# Patient Record
Sex: Female | Born: 1945 | Race: White | Hispanic: Yes
Health system: Southern US, Community
[De-identification: ages and names within clinical notes are randomized; demographics above are authoritative.]

## PROBLEM LIST (undated history)

## (undated) DIAGNOSIS — E119 Type 2 diabetes mellitus without complications: Secondary | ICD-10-CM

## (undated) DIAGNOSIS — I10 Essential (primary) hypertension: Secondary | ICD-10-CM

## (undated) DIAGNOSIS — F419 Anxiety disorder, unspecified: Secondary | ICD-10-CM

## (undated) DIAGNOSIS — G4700 Insomnia, unspecified: Secondary | ICD-10-CM

## (undated) DIAGNOSIS — IMO0001 Reserved for inherently not codable concepts without codable children: Secondary | ICD-10-CM

## (undated) DIAGNOSIS — K219 Gastro-esophageal reflux disease without esophagitis: Secondary | ICD-10-CM

## (undated) DIAGNOSIS — K2270 Barrett's esophagus without dysplasia: Secondary | ICD-10-CM

## (undated) DIAGNOSIS — R739 Hyperglycemia, unspecified: Secondary | ICD-10-CM

## (undated) HISTORY — PX: VAGINAL HYSTERECTOMY: SUR661

## (undated) HISTORY — DX: Anxiety disorder, unspecified: F41.9

## (undated) HISTORY — PX: CHOLECYSTECTOMY: SHX55

## (undated) HISTORY — DX: Gastro-esophageal reflux disease without esophagitis: K21.9

## (undated) HISTORY — PX: APPENDECTOMY: SHX54

## (undated) HISTORY — DX: Reserved for inherently not codable concepts without codable children: IMO0001

## (undated) HISTORY — DX: Type 2 diabetes mellitus without complications: E11.9

## (undated) HISTORY — DX: Barrett's esophagus without dysplasia: K22.70

## (undated) HISTORY — DX: Insomnia, unspecified: G47.00

## (undated) HISTORY — DX: Essential (primary) hypertension: I10

## (undated) HISTORY — PX: OTHER SURGICAL HISTORY: SHX169

## (undated) HISTORY — DX: Hyperglycemia, unspecified: R73.9

---

## 1998-02-02 LAB — HM COLONOSCOPY

## 1998-05-08 ENCOUNTER — Ambulatory Visit (HOSPITAL_COMMUNITY): Admission: RE | Admit: 1998-05-08 | Discharge: 1998-05-08 | Payer: Self-pay | Admitting: *Deleted

## 2002-10-11 ENCOUNTER — Encounter: Payer: Self-pay | Admitting: Urology

## 2002-10-11 ENCOUNTER — Ambulatory Visit (HOSPITAL_COMMUNITY): Admission: RE | Admit: 2002-10-11 | Discharge: 2002-10-11 | Payer: Self-pay | Admitting: Urology

## 2002-11-16 ENCOUNTER — Encounter: Payer: Self-pay | Admitting: Family Medicine

## 2002-11-16 ENCOUNTER — Ambulatory Visit (HOSPITAL_COMMUNITY): Admission: RE | Admit: 2002-11-16 | Discharge: 2002-11-16 | Payer: Self-pay | Admitting: Family Medicine

## 2003-03-27 ENCOUNTER — Ambulatory Visit (HOSPITAL_COMMUNITY): Admission: RE | Admit: 2003-03-27 | Discharge: 2003-03-27 | Payer: Self-pay | Admitting: Family Medicine

## 2004-04-03 ENCOUNTER — Ambulatory Visit (HOSPITAL_COMMUNITY): Admission: RE | Admit: 2004-04-03 | Discharge: 2004-04-03 | Payer: Self-pay | Admitting: Family Medicine

## 2005-01-01 ENCOUNTER — Ambulatory Visit (HOSPITAL_COMMUNITY): Admission: RE | Admit: 2005-01-01 | Discharge: 2005-01-01 | Payer: Self-pay | Admitting: Family Medicine

## 2006-06-22 ENCOUNTER — Ambulatory Visit (HOSPITAL_COMMUNITY): Admission: RE | Admit: 2006-06-22 | Discharge: 2006-06-22 | Payer: Self-pay | Admitting: Family Medicine

## 2006-12-17 ENCOUNTER — Encounter: Admission: RE | Admit: 2006-12-17 | Discharge: 2006-12-17 | Payer: Self-pay | Admitting: Family Medicine

## 2008-02-16 ENCOUNTER — Encounter: Admission: RE | Admit: 2008-02-16 | Discharge: 2008-02-16 | Payer: Self-pay | Admitting: Neurology

## 2008-08-20 LAB — HM PAP SMEAR: HM Pap smear: NORMAL

## 2010-01-28 ENCOUNTER — Encounter
Admission: RE | Admit: 2010-01-28 | Discharge: 2010-01-28 | Payer: Self-pay | Source: Home / Self Care | Attending: Neurosurgery | Admitting: Neurosurgery

## 2010-08-18 ENCOUNTER — Encounter: Payer: Self-pay | Admitting: Family Medicine

## 2010-08-18 DIAGNOSIS — G4700 Insomnia, unspecified: Secondary | ICD-10-CM | POA: Insufficient documentation

## 2010-08-18 DIAGNOSIS — K219 Gastro-esophageal reflux disease without esophagitis: Secondary | ICD-10-CM | POA: Insufficient documentation

## 2010-08-18 DIAGNOSIS — I10 Essential (primary) hypertension: Secondary | ICD-10-CM | POA: Insufficient documentation

## 2010-08-18 DIAGNOSIS — R739 Hyperglycemia, unspecified: Secondary | ICD-10-CM | POA: Insufficient documentation

## 2010-08-18 DIAGNOSIS — E119 Type 2 diabetes mellitus without complications: Secondary | ICD-10-CM | POA: Insufficient documentation

## 2010-08-18 DIAGNOSIS — K2270 Barrett's esophagus without dysplasia: Secondary | ICD-10-CM | POA: Insufficient documentation

## 2010-09-02 ENCOUNTER — Other Ambulatory Visit: Payer: Self-pay | Admitting: Family Medicine

## 2010-09-02 DIAGNOSIS — Z1231 Encounter for screening mammogram for malignant neoplasm of breast: Secondary | ICD-10-CM

## 2010-09-02 DIAGNOSIS — Z780 Asymptomatic menopausal state: Secondary | ICD-10-CM

## 2010-09-08 ENCOUNTER — Ambulatory Visit
Admission: RE | Admit: 2010-09-08 | Discharge: 2010-09-08 | Disposition: A | Discharge status: Home / Self Care | Payer: Medicare Other | Source: Ambulatory Visit | Attending: Family Medicine | Admitting: Family Medicine

## 2010-09-08 DIAGNOSIS — Z780 Asymptomatic menopausal state: Secondary | ICD-10-CM

## 2010-09-08 DIAGNOSIS — Z1231 Encounter for screening mammogram for malignant neoplasm of breast: Secondary | ICD-10-CM

## 2010-09-09 ENCOUNTER — Other Ambulatory Visit: Payer: Self-pay | Admitting: Family Medicine

## 2010-09-11 ENCOUNTER — Ambulatory Visit
Admission: RE | Admit: 2010-09-11 | Discharge: 2010-09-11 | Disposition: A | Discharge status: Home / Self Care | Payer: Medicare Other | Source: Ambulatory Visit | Attending: Family Medicine | Admitting: Family Medicine

## 2010-09-11 DIAGNOSIS — M8580 Other specified disorders of bone density and structure, unspecified site: Secondary | ICD-10-CM

## 2010-09-11 LAB — HM DEXA SCAN

## 2011-02-13 ENCOUNTER — Other Ambulatory Visit (HOSPITAL_COMMUNITY): Payer: Self-pay | Admitting: Urology

## 2011-02-13 DIAGNOSIS — R3129 Other microscopic hematuria: Secondary | ICD-10-CM

## 2011-02-13 DIAGNOSIS — N390 Urinary tract infection, site not specified: Secondary | ICD-10-CM

## 2011-02-18 ENCOUNTER — Ambulatory Visit (HOSPITAL_COMMUNITY)
Admission: RE | Admit: 2011-02-18 | Discharge: 2011-02-18 | Disposition: A | Discharge status: Home / Self Care | Payer: Medicare Other | Source: Ambulatory Visit | Attending: Urology | Admitting: Urology

## 2011-02-18 DIAGNOSIS — Q619 Cystic kidney disease, unspecified: Secondary | ICD-10-CM | POA: Insufficient documentation

## 2011-02-18 DIAGNOSIS — R3129 Other microscopic hematuria: Secondary | ICD-10-CM

## 2011-02-18 DIAGNOSIS — R1031 Right lower quadrant pain: Secondary | ICD-10-CM | POA: Insufficient documentation

## 2011-02-18 DIAGNOSIS — N390 Urinary tract infection, site not specified: Secondary | ICD-10-CM | POA: Insufficient documentation

## 2011-02-18 DIAGNOSIS — R319 Hematuria, unspecified: Secondary | ICD-10-CM | POA: Insufficient documentation

## 2011-02-18 MED ORDER — IOHEXOL 300 MG/ML  SOLN
125.0000 mL | Freq: Once | INTRAMUSCULAR | Status: AC | PRN
  Administered 2011-02-18: 125 mL via INTRAVENOUS

## 2011-12-24 LAB — HM COLONOSCOPY: HM Colonoscopy: NORMAL

## 2012-01-25 ENCOUNTER — Encounter (HOSPITAL_COMMUNITY): Payer: Self-pay | Admitting: Emergency Medicine

## 2012-01-25 ENCOUNTER — Emergency Department (HOSPITAL_COMMUNITY)
Admission: EM | Admit: 2012-01-25 | Discharge: 2012-01-25 | Disposition: A | Discharge status: Home / Self Care | Payer: Medicare Other | Attending: Emergency Medicine | Admitting: Emergency Medicine

## 2012-01-25 ENCOUNTER — Emergency Department (HOSPITAL_COMMUNITY): Payer: Medicare Other

## 2012-01-25 DIAGNOSIS — Z79899 Other long term (current) drug therapy: Secondary | ICD-10-CM | POA: Insufficient documentation

## 2012-01-25 DIAGNOSIS — R109 Unspecified abdominal pain: Secondary | ICD-10-CM | POA: Insufficient documentation

## 2012-01-25 DIAGNOSIS — I10 Essential (primary) hypertension: Secondary | ICD-10-CM | POA: Insufficient documentation

## 2012-01-25 DIAGNOSIS — G4700 Insomnia, unspecified: Secondary | ICD-10-CM | POA: Insufficient documentation

## 2012-01-25 DIAGNOSIS — M549 Dorsalgia, unspecified: Secondary | ICD-10-CM

## 2012-01-25 DIAGNOSIS — R0789 Other chest pain: Secondary | ICD-10-CM | POA: Insufficient documentation

## 2012-01-25 DIAGNOSIS — M545 Low back pain, unspecified: Secondary | ICD-10-CM | POA: Insufficient documentation

## 2012-01-25 DIAGNOSIS — E1169 Type 2 diabetes mellitus with other specified complication: Secondary | ICD-10-CM | POA: Insufficient documentation

## 2012-01-25 DIAGNOSIS — R079 Chest pain, unspecified: Secondary | ICD-10-CM

## 2012-01-25 DIAGNOSIS — K219 Gastro-esophageal reflux disease without esophagitis: Secondary | ICD-10-CM | POA: Insufficient documentation

## 2012-01-25 DIAGNOSIS — K2270 Barrett's esophagus without dysplasia: Secondary | ICD-10-CM | POA: Insufficient documentation

## 2012-01-25 DIAGNOSIS — F411 Generalized anxiety disorder: Secondary | ICD-10-CM | POA: Insufficient documentation

## 2012-01-25 LAB — COMPREHENSIVE METABOLIC PANEL
ALT: 11 U/L (ref 0–35)
AST: 23 U/L (ref 0–37)
Albumin: 4.2 g/dL (ref 3.5–5.2)
Alkaline Phosphatase: 55 U/L (ref 39–117)
BUN: 11 mg/dL (ref 6–23)
Potassium: 3.9 mEq/L (ref 3.5–5.1)
Sodium: 140 mEq/L (ref 135–145)
Total Protein: 7.5 g/dL (ref 6.0–8.3)

## 2012-01-25 LAB — CBC WITH DIFFERENTIAL/PLATELET
Basophils Absolute: 0 10*3/uL (ref 0.0–0.1)
Basophils Relative: 0 % (ref 0–1)
Eosinophils Absolute: 0.2 10*3/uL (ref 0.0–0.7)
MCH: 33.1 pg (ref 26.0–34.0)
MCHC: 33.7 g/dL (ref 30.0–36.0)
Neutrophils Relative %: 55 % (ref 43–77)
Platelets: 188 10*3/uL (ref 150–400)
RDW: 13 % (ref 11.5–15.5)

## 2012-01-25 LAB — URINE MICROSCOPIC-ADD ON

## 2012-01-25 LAB — URINALYSIS, ROUTINE W REFLEX MICROSCOPIC
Glucose, UA: NEGATIVE mg/dL
Ketones, ur: NEGATIVE mg/dL
pH: 7.5 (ref 5.0–8.0)

## 2012-01-25 MED ORDER — CYCLOBENZAPRINE HCL 10 MG PO TABS
10.0000 mg | ORAL_TABLET | Freq: Once | ORAL | Status: AC
  Administered 2012-01-25: 10 mg via ORAL
  Filled 2012-01-25: qty 1

## 2012-01-25 MED ORDER — OXYCODONE-ACETAMINOPHEN 5-325 MG PO TABS
1.0000 | ORAL_TABLET | ORAL | Status: DC | PRN
Start: 2012-01-25 — End: 2012-05-18

## 2012-01-25 MED ORDER — IBUPROFEN 600 MG PO TABS: 600.0000 mg | ORAL_TABLET | Freq: Four times a day (QID) | ORAL | Status: DC | PRN

## 2012-01-25 MED ORDER — CYCLOBENZAPRINE HCL 10 MG PO TABS: 10.0000 mg | ORAL_TABLET | Freq: Two times a day (BID) | ORAL | Status: DC | PRN

## 2012-01-25 NOTE — ED Provider Notes (Signed)
History     CSN: [id number]  Arrival date & time 01/25/12  1047   First MD Initiated Contact with Patient 01/25/12 1109      Chief Complaint  Patient presents with  . Back Pain    (Consider location/radiation/quality/duration/timing/severity/associated sxs/prior treatment) HPI Comments: 66 year old female presents to the emergency department via EMS with her husband complaining of sudden onset lower back pain beginning this morning. The pain began while she was making her bed and changing sheets along with putting things in the hamper. States she felt a pull in the sharp pain rated 10 out of 10 in her lower back radiating to both sides. Fentanyl given by EMS decreased the pain 7/10, however she does not want any more pain medications to make her feel drowsy. Denies ever having back pain like this before. No fever. Denies numbness, pain or tingling down her legs. No loss of control of bowels or bladder or saddle anesthesia. Patient also complaining of chest discomfort waking her up from sleep the past 2 nights radiating down towards her abdomen. States her abdomen is a little tender. Her reflux medications are not providing relief.   Patient is a 66 y.o. female presenting with back pain. The history is provided by the patient and the spouse.  Back Pain  Associated symptoms include chest pain and abdominal pain. Pertinent negatives include no fever, no numbness and no weakness.    Past Medical History  Diagnosis Date  . GERD (gastroesophageal reflux disease)   . Hypertension   . NIDDM (non-insulin dependent diabetes mellitus)   . Hyperglycemia   . Anxiety   . Insomnia   . Barrett's esophagus     Past Surgical History  Procedure Date  . Appendicitis   . Cholecystitis   . Vaginal hysterectomy     No family history on file.  History  Substance Use Topics  . Smoking status: Never Smoker   . Smokeless tobacco: Not on file  . Alcohol Use: Yes    OB History    Grav Para  Term Preterm Abortions TAB SAB Ect Mult Living                  Review of Systems  Constitutional: Negative for fever and chills.  Respiratory: Negative for shortness of breath.   Cardiovascular: Positive for chest pain.  Gastrointestinal: Positive for abdominal pain.  Genitourinary: Negative.   Musculoskeletal: Positive for back pain.  Neurological: Negative for weakness and numbness.  All other systems reviewed and are negative.    Allergies  Codeine  Home Medications   Current Outpatient Rx  Name  Route  Sig  Dispense  Refill  . CALCIUM CARBONATE 600 MG PO TABS   Oral   Take 600 mg by mouth daily.           Marland Kitchen MAGNESIUM 30 MG PO TABS   Oral   Take 30 mg by mouth 2 (two) times daily.           Marland Kitchen METOPROLOL TARTRATE 25 MG PO TABS   Oral   Take 25 mg by mouth 2 (two) times daily.           Marland Kitchen OVER THE COUNTER MEDICATION   Oral   Take 1 capsule by mouth daily. Vitamin D 1200 units         . POTASSIUM CHLORIDE CRYS ER 20 MEQ PO TBCR   Oral   Take 20 mEq by mouth daily.         Marland Kitchen  VITAMIN B-12 250 MCG PO TABS   Oral   Take 250 mcg by mouth daily.         Marland Kitchen ZOLPIDEM TARTRATE 10 MG PO TABS   Oral   Take 10 mg by mouth at bedtime as needed. For sleep           BP 133/77  Pulse 60  Temp 97.9 F (36.6 C) (Oral)  Resp 17  SpO2 99%  Physical Exam  Nursing note and vitals reviewed. Constitutional: She is oriented to person, place, and time. She appears well-developed and well-nourished. No distress.       Laying flat on bed for comfort.  HENT:  Head: Normocephalic and atraumatic.  Eyes: Conjunctivae normal and EOM are normal. Pupils are equal, round, and reactive to light.  Neck: Normal range of motion. Neck supple.  Cardiovascular: Normal rate, regular rhythm, normal heart sounds and intact distal pulses.   Pulmonary/Chest: Effort normal and breath sounds normal.  Abdominal: Soft. Bowel sounds are normal. There is tenderness in the periumbilical  area. There is no guarding.  Musculoskeletal:       Lumbar back: She exhibits tenderness (bilateral paraspinal muscles and SI joints) and spasm. She exhibits no bony tenderness and normal pulse.  Neurological: She is alert and oriented to person, place, and time. She has normal strength. No sensory deficit.  Skin: Skin is warm and dry. No rash noted.  Psychiatric: She has a normal mood and affect. Her speech is normal and behavior is normal.    ED Course  Procedures (including critical care time)  Labs Reviewed  CBC WITH DIFFERENTIAL - Abnormal; Notable for the following:    RBC 3.60 (*)     Hemoglobin 11.9 (*)     HCT 35.3 (*)     All other components within normal limits  COMPREHENSIVE METABOLIC PANEL - Abnormal; Notable for the following:    Total Bilirubin 0.2 (*)     GFR calc non Af Amer 68 (*)     GFR calc Af Amer 79 (*)     All other components within normal limits  URINALYSIS, ROUTINE W REFLEX MICROSCOPIC - Abnormal; Notable for the following:    Leukocytes, UA TRACE (*)     All other components within normal limits  URINE MICROSCOPIC-ADD ON  POCT I-STAT TROPONIN I   Dg Chest 2 View  01/25/2012  *RADIOLOGY REPORT*  Clinical Data: Back pain  CHEST - 2 VIEW  Comparison: 06/22/2006  Findings: Heart size and vascularity are normal.  Aortic arch is normal.  Lungs are clear without infiltrate or effusion.  Negative for heart failure.  Rounded nodules in both lung bases are symmetric and compatible with nipple shadows.  IMPRESSION: No acute cardiopulmonary abnormality.   Original Report Authenticated By: Janeece Riggers, M.D.    Dg Lumbar Spine Complete  01/25/2012  *RADIOLOGY REPORT*  Clinical Data: Low back pain  LUMBAR SPINE - COMPLETE 4+ VIEW  Comparison:  None.  Findings:  There is no evidence of lumbar spine fracture. Alignment is normal.  Intervertebral disc spaces are maintained.  IMPRESSION: Negative.   Original Report Authenticated By: Janeece Riggers, M.D.     Date:  01/25/2012  Rate: 64  Rhythm: normal sinus rhythm  QRS Axis: normal  Intervals: normal  ST/T Wave abnormalities: normal  Conduction Disutrbances:none  Narrative Interpretation: sinus rhythm, borderline RAD  Old EKG Reviewed: none available    1. Back pain   2. Chest pain  MDM  66 y/o female with back pain. Findings consistent with lumbar strain. Pain beginning to improve with flexeril. Chest pain not concerning for any cardiac origin. EKG, CXR and labs unremarkable. Urine clean. No fever. Normal vital signs. Patient stable for discharge. Will discharge with percocet, flexeril, and ibuprofen. Return precautions discussed. She will f/u with her PCP. Case discussed with Dr. Karma Ganja who also evaluated patient and agrees with plan of care.         Trevor Mace, PA-C 01/25/12 1438

## 2012-01-25 NOTE — ED Notes (Signed)
Pt states she was picking up presents this am when her back pain struck.

## 2012-01-25 NOTE — ED Provider Notes (Addendum)
Medical screening examination/treatment/procedure(s) were performed by non-physician practitioner and as supervising physician I was immediately available for consultation/collaboration.    Date: 01/25/2012  Rate: 64  Rhythm: normal sinus rhythm  QRS Axis: normal  Intervals: normal  ST/T Wave abnormalities: normal  Conduction Disutrbances:none  Narrative Interpretation:   Old EKG Reviewed: none available  Ethelda Chick, MD 01/25/12 1501  Ethelda Chick, MD 01/25/12 [phone]

## 2012-01-25 NOTE — ED Notes (Signed)
Per EMS, pt's back pain started this am. Pt states pain starts in center lower back and radiates to sides. No trauma. Pt reports cp that spread downward to stomach. A&Ox4, ambulatory, stable.

## 2012-01-25 NOTE — ED Notes (Signed)
Pt reported that she has had several kidney stones in the past and has requested an exam of her kidneys

## 2012-01-25 NOTE — ED Notes (Signed)
Pt back in room from X-ray.

## 2012-01-25 NOTE — ED Notes (Signed)
Per EMS, pt received fentanyl en route. Pain decreased from 10/10-7/10. Pt refused additional fentanyl, making her sleepy.

## 2012-03-31 ENCOUNTER — Other Ambulatory Visit: Payer: Self-pay | Admitting: Family Medicine

## 2012-04-25 ENCOUNTER — Other Ambulatory Visit: Payer: Self-pay

## 2012-04-25 DIAGNOSIS — Z1231 Encounter for screening mammogram for malignant neoplasm of breast: Secondary | ICD-10-CM

## 2012-04-26 ENCOUNTER — Ambulatory Visit: Payer: Medicare Other

## 2012-05-10 ENCOUNTER — Ambulatory Visit
Admission: RE | Admit: 2012-05-10 | Discharge: 2012-05-10 | Disposition: A | Discharge status: Home / Self Care | Payer: Medicare Other | Source: Ambulatory Visit

## 2012-05-10 DIAGNOSIS — Z1231 Encounter for screening mammogram for malignant neoplasm of breast: Secondary | ICD-10-CM

## 2012-05-11 LAB — HM MAMMOGRAPHY: HM Mammogram: NORMAL

## 2012-05-12 ENCOUNTER — Other Ambulatory Visit: Payer: Self-pay | Admitting: Family Medicine

## 2012-05-12 DIAGNOSIS — R928 Other abnormal and inconclusive findings on diagnostic imaging of breast: Secondary | ICD-10-CM

## 2012-05-18 ENCOUNTER — Encounter: Payer: Self-pay | Admitting: Family Medicine

## 2012-05-18 ENCOUNTER — Ambulatory Visit (INDEPENDENT_AMBULATORY_CARE_PROVIDER_SITE_OTHER): Payer: Medicare Other | Admitting: Family Medicine

## 2012-05-18 VITALS — BP 110/80 | HR 62 | Temp 98.1°F | Resp 14 | Wt 115.0 lb

## 2012-05-18 DIAGNOSIS — N63 Unspecified lump in unspecified breast: Secondary | ICD-10-CM

## 2012-05-18 DIAGNOSIS — N6320 Unspecified lump in the left breast, unspecified quadrant: Secondary | ICD-10-CM

## 2012-05-18 NOTE — Progress Notes (Signed)
  Subjective:    Patient ID: Gabrielle Griffith, female    DOB: 1945-04-04, 67 y.o.   MRN: [id number]  HPI Patient is here today for discussion. During her recent mammogram she was found to have a suspicious mass in her left breast. They recommended diagnostic mammogram along with left breast ultrasound. She would like my opinion on this. She denies any breast pain, or palpable mass.  She is hesitant to proceed with workup due to the financial cost.  She has no family history of breast cancer. Past Medical History  Diagnosis Date  . GERD (gastroesophageal reflux disease)   . Hypertension   . NIDDM (non-insulin dependent diabetes mellitus)   . Hyperglycemia   . Anxiety   . Insomnia   . Barrett's esophagus    Current Outpatient Prescriptions on File Prior to Visit  Medication Sig Dispense Refill  . calcium carbonate (OS-CAL) 600 MG TABS Take 600 mg by mouth daily.        Marland Kitchen ibuprofen (ADVIL,MOTRIN) 600 MG tablet Take 1 tablet (600 mg total) by mouth every 6 (six) hours as needed for pain.  30 tablet  0  . magnesium 30 MG tablet Take 30 mg by mouth 2 (two) times daily.        . metoprolol tartrate (LOPRESSOR) 25 MG tablet Take 25 mg by mouth 2 (two) times daily.        Marland Kitchen OVER THE COUNTER MEDICATION Take 1 capsule by mouth daily. Vitamin D 1200 units      . potassium chloride SA (K-DUR,KLOR-CON) 20 MEQ tablet Take 20 mEq by mouth daily.      . vitamin B-12 (CYANOCOBALAMIN) 250 MCG tablet 250 mcg. Twice a week      . zolpidem (AMBIEN) 10 MG tablet Take 10 mg by mouth at bedtime as needed. For sleep       No current facility-administered medications on file prior to visit.   History   Social History  . Marital Status: Married    Spouse Name: N/A    Number of Children: N/A  . Years of Education: N/A   Occupational History  . Not on file.   Social History Main Topics  . Smoking status: Never Smoker   . Smokeless tobacco: Not on file  . Alcohol Use: Yes  . Drug Use: No  . Sexually  Active:    Other Topics Concern  . Not on file   Social History Narrative  . No narrative on file   Family history-both mother and father lived to be greater than 74 years of age.   Review of Systems  All other systems reviewed and are negative.       Objective:   Physical Exam There was no physical exam today. The entire 10 minute office visit was spent in discussion in reviewing the patient's mammogram results with her in detail.       Assessment & Plan:  1. Left breast mass Recommended strongly that patient proceed with left breast ultrasound and diagnostic mammogram. Stated that this is the time actively want to treat this. This could represent a false positive. However it would be irresponsible to wait until any potential cancer has spread before we do anything to treat it.  Patient understands and will proceed with further diagnostic workup.

## 2012-06-02 ENCOUNTER — Ambulatory Visit
Admission: RE | Admit: 2012-06-02 | Discharge: 2012-06-02 | Disposition: A | Discharge status: Home / Self Care | Payer: Medicare Other | Source: Ambulatory Visit | Attending: Family Medicine | Admitting: Family Medicine

## 2012-06-02 DIAGNOSIS — R928 Other abnormal and inconclusive findings on diagnostic imaging of breast: Secondary | ICD-10-CM

## 2012-10-20 ENCOUNTER — Encounter: Payer: Self-pay | Admitting: Physician Assistant

## 2012-10-20 ENCOUNTER — Ambulatory Visit (INDEPENDENT_AMBULATORY_CARE_PROVIDER_SITE_OTHER): Payer: Medicare Other | Admitting: Physician Assistant

## 2012-10-20 VITALS — BP 104/60 | HR 80 | Temp 97.1°F | Resp 20 | Wt 111.0 lb

## 2012-10-20 DIAGNOSIS — G4700 Insomnia, unspecified: Secondary | ICD-10-CM

## 2012-10-20 DIAGNOSIS — I10 Essential (primary) hypertension: Secondary | ICD-10-CM

## 2012-10-20 DIAGNOSIS — K219 Gastro-esophageal reflux disease without esophagitis: Secondary | ICD-10-CM

## 2012-10-20 DIAGNOSIS — K2270 Barrett's esophagus without dysplasia: Secondary | ICD-10-CM

## 2012-10-20 DIAGNOSIS — R739 Hyperglycemia, unspecified: Secondary | ICD-10-CM

## 2012-10-20 DIAGNOSIS — R7309 Other abnormal glucose: Secondary | ICD-10-CM

## 2012-10-20 DIAGNOSIS — E119 Type 2 diabetes mellitus without complications: Secondary | ICD-10-CM

## 2012-10-20 LAB — COMPLETE METABOLIC PANEL WITH GFR
Alkaline Phosphatase: 59 U/L (ref 39–117)
BUN: 14 mg/dL (ref 6–23)
GFR, Est Non African American: 72 mL/min
Glucose, Bld: 91 mg/dL (ref 70–99)
Sodium: 139 mEq/L (ref 135–145)
Total Bilirubin: 0.4 mg/dL (ref 0.3–1.2)
Total Protein: 7.7 g/dL (ref 6.0–8.3)

## 2012-10-20 LAB — HEMOGLOBIN A1C
Hgb A1c MFr Bld: 6.1 % — ABNORMAL HIGH (ref <5.7)
Mean Plasma Glucose: 128 mg/dL — ABNORMAL HIGH (ref <117)

## 2012-10-20 MED ORDER — ZOLPIDEM TARTRATE 5 MG PO TABS: 5.0000 mg | ORAL_TABLET | Freq: Every evening | ORAL | Status: DC | PRN

## 2012-10-20 NOTE — Progress Notes (Signed)
Patient ID: Gabrielle Griffith MRN: [id number], DOB: 06-29-1945, 67 y.o. Date of Encounter: @DATE @  Chief Complaint:  Chief Complaint  Patient presents with  . Medication Refill    HPI: 67 y.o. year old very pleasant white female  presents for routine followup office visit.  She says that she has been retired for 10 years and her husband just recently retired. She says that he is "driving her crazy."  Says that his job had been a Hydrologist. She says that even with that job he was not the one actually doing the work he was just supervising and "pointing his finger to tell the workers what to do." Says that he has no hobbies or sports or activities that he is involved with.  She has been used to going to the grocery store and doing things as she pleases. Now he is constantly wanting to tach along with her. Says that she used Ambien in the past and had gotten where she has not needed this for a long time. However she is now requesting a refill on this. Says she really needs something to help her sleep. Does not feel that she needs to be on anxiety medicines or anything else just feels as if she could sleep this would help.  She has no other active complaints today.  She is taking her Lopressor as directed.  She exercises with doing squats and stretches. She says that she sees a chiropractor routinely and does exercises that the chiropractor has her do. As well she walks on a treadmill 30 minutes every day.   Past Medical History  Diagnosis Date  . GERD (gastroesophageal reflux disease)   . Hypertension   . NIDDM (non-insulin dependent diabetes mellitus)   . Hyperglycemia   . Anxiety   . Insomnia   . Barrett's esophagus      Home Meds: See attached medication section for current medication list. Any medications entered into computer today will not appear on this note's list. The medications listed below were entered prior to today. Current Outpatient Prescriptions on File  Prior to Visit  Medication Sig Dispense Refill  . calcium carbonate (OS-CAL) 600 MG TABS Take 600 mg by mouth daily.        . magnesium 30 MG tablet Take 30 mg by mouth 2 (two) times daily.        . metoprolol tartrate (LOPRESSOR) 25 MG tablet Take 25 mg by mouth 2 (two) times daily.        . vitamin B-12 (CYANOCOBALAMIN) 250 MCG tablet 250 mcg. Twice a week       No current facility-administered medications on file prior to visit.    Allergies:  Allergies  Allergen Reactions  . Codeine Nausea Only    Reaction: Hallucinations.    History   Social History  . Marital Status: Married    Spouse Name: N/A    Number of Children: N/A  . Years of Education: N/A   Occupational History  . Not on file.   Social History Main Topics  . Smoking status: Never Smoker   . Smokeless tobacco: Not on file  . Alcohol Use: Yes  . Drug Use: No  . Sexual Activity:    Other Topics Concern  . Not on file   Social History Narrative  . No narrative on file    History reviewed. No pertinent family history.   Review of Systems:  See HPI for pertinent ROS. All other ROS negative.  Physical Exam: Blood pressure 104/60, pulse 80, temperature 97.1 F (36.2 C), temperature source Oral, resp. rate 20, weight 111 lb (50.349 kg)., There is no height on file to calculate BMI. General: Well-nourished well-developed white female . Very pleasant . Very fit .    Appears in no acute distress. Neck: Supple. No thyromegaly. No lymphadenopathy. No carotid bruits Lungs: Clear bilaterally to auscultation without wheezes, rales, or rhonchi. Breathing is unlabored. Heart: RRR with S1 S2. No murmurs, rubs, or gallops. Abdomen: Soft, non-tender, non-distended with normoactive bowel sounds. No hepatomegaly. No rebound/guarding. No obvious abdominal masses. Musculoskeletal:  Strength and tone normal for age. Extremities/Skin: Warm and dry. No edema. No rashes or suspicious lesions. Neuro: Alert and oriented X 3.  Moves all extremities spontaneously. Gait is normal. CNII-XII grossly in tact. Psych:  Responds to questions appropriately with a normal affect. Very pleasant and in good spirits and funny speaking about her husband      ASSESSMENT AND PLAN:  79 y.o. year old female with  1. Hypertension Blood pressure is at goal. Continue current medication. - COMPLETE METABOLIC PANEL WITH GFR  2. Diabetes Her chart has history of diabetes and hyperglycemia. However I do not see that her sugar has been elevated. We'll recheck now. If her sugar and A1c are normal then I will need to remove these from both the history and problem list. - COMPLETE METABOLIC PANEL WITH GFR - Hemoglobin A1c  3. Hyperglycemia See note under #2 above - COMPLETE METABOLIC PANEL WITH GFR  4. Insomnia - zolpidem (AMBIEN) 5 MG tablet; Take 1 tablet (5 mg total) by mouth at bedtime as needed for sleep.  Dispense: 15 tablet; Refill: 1  5. Barrett's esophagus Note this is also on history. However noted that she is not on any PPI. Asked patient about this. She says that when she went for a bone density test the lady there told her that proton pump inhibitors which cause degenerative bends. She stopped the PPI. She says that she has seen Dr. Elnoria Howard for her colonoscopy since that and that he is aware. He told her it was okay to stay off of PPI.  6. GERD (gastroesophageal reflux disease) Asymptomatic off of PPI. See #5 above.  #7 mammogram she just recently had this and it was normal. Recheck 1 year.  Screening colonoscopy: She says that she just recently had this for Dr. Elnoria Howard.  DEXA scan was performed 09/11/10. T-scores were -1.6 and -1.5. She's been on it calcium and vitamin D. She does regular exercise.  Immunizations: She had tetanus in 2012. She had Pneumovax 11/08/07. She had Zostavax 11/08/07.  Regular office visit 6 months or sooner if she needs [place].   Signed, [address] [place], [place], [facility] 10/20/2012 10:51 AM

## 2012-10-24 ENCOUNTER — Encounter: Payer: Self-pay | Admitting: Physician Assistant

## 2012-11-25 ENCOUNTER — Encounter: Payer: Self-pay | Admitting: Family Medicine

## 2012-11-25 ENCOUNTER — Ambulatory Visit (INDEPENDENT_AMBULATORY_CARE_PROVIDER_SITE_OTHER): Payer: Medicare Other | Admitting: Family Medicine

## 2012-11-25 VITALS — BP 100/60 | HR 78 | Temp 97.6°F | Resp 18 | Wt 111.5 lb

## 2012-11-25 DIAGNOSIS — T7840XA Allergy, unspecified, initial encounter: Secondary | ICD-10-CM

## 2012-11-25 DIAGNOSIS — N390 Urinary tract infection, site not specified: Secondary | ICD-10-CM

## 2012-11-25 LAB — BASIC METABOLIC PANEL WITH GFR
Calcium: 9.9 mg/dL (ref 8.4–10.5)
GFR, Est African American: 63 mL/min
Sodium: 138 mEq/L (ref 135–145)

## 2012-11-25 LAB — URINALYSIS, ROUTINE W REFLEX MICROSCOPIC
Leukocytes, UA: NEGATIVE
Protein, ur: NEGATIVE mg/dL
Urobilinogen, UA: 0.2 mg/dL (ref 0.0–1.0)

## 2012-11-25 LAB — CBC WITH DIFFERENTIAL/PLATELET
Basophils Absolute: 0 10*3/uL (ref 0.0–0.1)
Eosinophils Absolute: 0.1 10*3/uL (ref 0.0–0.7)
Lymphocytes Relative: 38 % (ref 12–46)
Lymphs Abs: 1.9 10*3/uL (ref 0.7–4.0)
MCH: 33.2 pg (ref 26.0–34.0)
Neutrophils Relative %: 45 % (ref 43–77)
Platelets: 250 10*3/uL (ref 150–400)
RBC: 3.55 MIL/uL — ABNORMAL LOW (ref 3.87–5.11)
RDW: 13.1 % (ref 11.5–15.5)
WBC: 5 10*3/uL (ref 4.0–10.5)

## 2012-11-25 LAB — URINALYSIS, MICROSCOPIC ONLY

## 2012-11-25 MED ORDER — PREDNISONE 20 MG PO TABS: ORAL_TABLET | ORAL | Status: DC

## 2012-11-25 NOTE — Addendum Note (Signed)
Addended by: Reginia Forts on: 11/25/2012 12:48 PM   Modules accepted: Orders

## 2012-11-25 NOTE — Progress Notes (Signed)
Subjective:    Patient ID: Gabrielle Griffith, female    DOB: Jun 01, 1945, 67 y.o.   MRN: [id number]  HPI Patient went to an urgent care on Saturday with dysuria and abdominal pain. She was diagnosed with urinary tract infection and was started on Bactrim double strength tablets 1 by mouth twice a day for 7 days. She was also given Indocin for pain. Starting yesterday she developed a diffuse red maculopapular rash on her abdomen back arms and legs. She also developed cramping pain in her arms and legs. She denies any rashes or lesions in her mouth. She denies any swelling in her lips or tongue. The dysuria has completely improved. She has no more abdominal pain, dysuria, urgency, frequency, or fever. Now she's concerned about the rash and the muscle cramps in her calves and legs Past Medical History  Diagnosis Date  . GERD (gastroesophageal reflux disease)   . Hypertension   . Hyperglycemia   . Anxiety   . Insomnia   . Barrett's esophagus   . NIDDM (non-insulin dependent diabetes mellitus)    Current Outpatient Prescriptions on File Prior to Visit  Medication Sig Dispense Refill  . calcium carbonate (OS-CAL) 600 MG TABS Take 600 mg by mouth daily.        . magnesium 30 MG tablet Take 30 mg by mouth 2 (two) times daily.        . metoprolol tartrate (LOPRESSOR) 25 MG tablet Take 25 mg by mouth 2 (two) times daily.        . vitamin B-12 (CYANOCOBALAMIN) 250 MCG tablet 250 mcg. Twice a week      . zolpidem (AMBIEN) 5 MG tablet Take 1 tablet (5 mg total) by mouth at bedtime as needed for sleep.  15 tablet  1   No current facility-administered medications on file prior to visit.   Allergies  Allergen Reactions  . Codeine Nausea Only    Reaction: Hallucinations.  . Sulfa Antibiotics     Rash    History   Social History  . Marital Status: Married    Spouse Name: N/A    Number of Children: N/A  . Years of Education: N/A   Occupational History  . Not on file.   Social History Main  Topics  . Smoking status: Never Smoker   . Smokeless tobacco: Not on file  . Alcohol Use: Yes  . Drug Use: No  . Sexual Activity:    Other Topics Concern  . Not on file   Social History Narrative  . No narrative on file     Review of Systems  All other systems reviewed and are negative.       Objective:   Physical Exam  Vitals reviewed. Constitutional: She is oriented to person, place, and time. She appears well-developed and well-nourished.  HENT:  Right Ear: External ear normal.  Left Ear: External ear normal.  Nose: Nose normal.  Mouth/Throat: Oropharynx is clear and moist. No oropharyngeal exudate.  Neck: Neck supple.  Cardiovascular: Normal rate and regular rhythm.   Pulmonary/Chest: Effort normal and breath sounds normal. No respiratory distress. She has no wheezes. She has no rales. She exhibits no tenderness.  Abdominal: Soft. Bowel sounds are normal. She exhibits no distension and no mass. There is no tenderness. There is no rebound and no guarding.  Musculoskeletal: She exhibits no edema and no tenderness.  Lymphadenopathy:    She has no cervical adenopathy.  Neurological: She is alert and oriented to person, place,  and time. She has normal reflexes. She displays normal reflexes. No cranial nerve deficit. She exhibits normal muscle tone. Coordination normal.  Skin: Rash noted.   Patient has a diffuse fine red papular rash on her calm, abdomen, arms, and legs. She has no swelling in her tongue or lips. There are no oral lesions.       Assessment & Plan:  UTI (urinary tract infection) - Plan: Urinalysis, Routine w reflex microscopic, BASIC METABOLIC PANEL WITH GFR, CBC with Differential  Allergic reaction, initial encounter - Plan: predniSONE (DELTASONE) 20 MG tablet  urinalysis today is essentially normal. The urinary tract infection is completely resolved. I believe the majority of her symptoms now are due to an allergic reaction to either the sulfa  antibiotic or indomethacin.  I recommended she discontinue both medicines immediately and start Benadryl 25 mg every 4 hours as needed for rash or pain. I also got a prescription for prednisone to take for the allergic reaction particularly gets worse. Also check a CBC and CMP to rule out electrolyte of her mouth is causing her cramps. 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

## 2012-11-26 LAB — URINE CULTURE: Colony Count: NO GROWTH

## 2012-11-30 ENCOUNTER — Encounter: Payer: Self-pay | Admitting: Family Medicine

## 2012-12-08 ENCOUNTER — Other Ambulatory Visit: Payer: Self-pay

## 2012-12-08 ENCOUNTER — Encounter: Payer: Self-pay | Admitting: Family Medicine

## 2012-12-12 ENCOUNTER — Other Ambulatory Visit: Payer: Self-pay | Admitting: Physician Assistant

## 2012-12-12 NOTE — Telephone Encounter (Signed)
?   OK to Refill  

## 2012-12-12 NOTE — Telephone Encounter (Signed)
Approved for # 30 plus 2 additional refills.

## 2013-04-12 ENCOUNTER — Other Ambulatory Visit: Payer: Self-pay | Admitting: Family Medicine

## 2013-04-12 MED ORDER — METOPROLOL TARTRATE 25 MG PO TABS: 25.0000 mg | ORAL_TABLET | Freq: Two times a day (BID) | ORAL | Status: DC

## 2013-04-12 NOTE — Telephone Encounter (Signed)
Rx Refilled  

## 2013-04-19 ENCOUNTER — Ambulatory Visit: Payer: Medicare Other | Admitting: Physician Assistant

## 2013-05-01 ENCOUNTER — Encounter: Payer: Self-pay | Admitting: Physician Assistant

## 2013-05-01 ENCOUNTER — Ambulatory Visit (INDEPENDENT_AMBULATORY_CARE_PROVIDER_SITE_OTHER): Payer: Medicare Other | Admitting: Physician Assistant

## 2013-05-01 VITALS — BP 104/66 | HR 64 | Temp 98.3°F | Resp 18 | Ht 61.25 in | Wt 112.0 lb

## 2013-05-01 DIAGNOSIS — R7309 Other abnormal glucose: Secondary | ICD-10-CM

## 2013-05-01 DIAGNOSIS — R739 Hyperglycemia, unspecified: Secondary | ICD-10-CM

## 2013-05-01 DIAGNOSIS — I10 Essential (primary) hypertension: Secondary | ICD-10-CM

## 2013-05-01 DIAGNOSIS — K2270 Barrett's esophagus without dysplasia: Secondary | ICD-10-CM

## 2013-05-01 DIAGNOSIS — K219 Gastro-esophageal reflux disease without esophagitis: Secondary | ICD-10-CM

## 2013-05-01 DIAGNOSIS — G4700 Insomnia, unspecified: Secondary | ICD-10-CM

## 2013-05-01 DIAGNOSIS — Z23 Encounter for immunization: Secondary | ICD-10-CM

## 2013-05-01 LAB — HEMOGLOBIN A1C, FINGERSTICK: Hgb A1C (fingerstick): 6 % — ABNORMAL HIGH (ref <5.7)

## 2013-05-02 NOTE — Progress Notes (Signed)
Patient ID: VERITY GILCREST MRN: [id number], DOB: 13-Aug-1945, 68 y.o. Date of Encounter: @DATE @  Chief Complaint:  Chief Complaint  Patient presents with  . 6 mth check up    HPI: 68 y.o. year old female  presents for routine follow up office visit.   Her last office visit with me was 10/20/12. At that time she reported that she has been retired for 10 years but her husband had just recently retired. At that visit she stated that he was  "driving her crazy".  At that time reported that he had no hobbies or sports or activities that he had been involved with. She had been used to going to the grocery store and doing things as she pleased. At the time at that visit he was wanting to constantly tag along with her. Today she says that he has gotten a part-time job at a golf course which has helped some.!!  She has no complaints today. Says she's been feeling good. She continues to do her exercises. She walks on a treadmill 30 minutes every day. She also does squats and stretches. She had seen a chiropractor in the past who had taught her some exercises that she continues to do at home daily.  She does have problems with insomnia and uses Ambien for this. This works well for her and causes no adverse effects.  She is taking her Lopressor as directed. No adverse effects with this.   Past Medical History  Diagnosis Date  . GERD (gastroesophageal reflux disease)   . Hypertension   . Hyperglycemia   . Anxiety   . Insomnia   . Barrett's esophagus   . NIDDM (non-insulin dependent diabetes mellitus)      Home Meds: See attached medication section for current medication list. Any medications entered into computer today will not appear on this note's list. The medications listed below were entered prior to today. Current Outpatient Prescriptions on File Prior to Visit  Medication Sig Dispense Refill  . calcium carbonate (OS-CAL) 600 MG TABS Take 600 mg by mouth daily.        . magnesium 30  MG tablet Take 30 mg by mouth 2 (two) times daily.        . metoprolol tartrate (LOPRESSOR) 25 MG tablet Take 1 tablet (25 mg total) by mouth 2 (two) times daily.  60 tablet  11  . vitamin B-12 (CYANOCOBALAMIN) 250 MCG tablet 250 mcg. Twice a week      . zolpidem (AMBIEN) 5 MG tablet TAKE 1 TABLET BY MOUTH DAILY AT BEDTIME AS NEEDED FOR SLEEP  30 tablet  2   No current facility-administered medications on file prior to visit.    Allergies:  Allergies  Allergen Reactions  . Codeine Nausea Only    Reaction: Hallucinations.  . Sulfa Antibiotics     Rash     History   Social History  . Marital Status: Married    Spouse Name: N/A    Number of Children: N/A  . Years of Education: N/A   Occupational History  . Not on file.   Social History Main Topics  . Smoking status: Never Smoker   . Smokeless tobacco: Not on file  . Alcohol Use: Yes  . Drug Use: No  . Sexual Activity:    Other Topics Concern  . Not on file   Social History Narrative  . No narrative on file    No family history on file.   Review of  Systems:  See HPI for pertinent ROS. All other ROS negative.    Physical Exam: Blood pressure 104/66, pulse 64, temperature 98.3 F (36.8 C), temperature source Oral, resp. rate 18, height 5' 1.25" (1.556 m), weight 112 lb (50.803 kg)., Body mass index is 20.98 kg/(m^2). General: WNWD Female. Very fit. Appears in no acute distress. Neck: Supple. No thyromegaly. No lymphadenopathy. No carotid bruits. Lungs: Clear bilaterally to auscultation without wheezes, rales, or rhonchi. Breathing is unlabored. Heart: RRR with S1 S2. No murmurs, rubs, or gallops. Abdomen: Soft, non-tender, non-distended with normoactive bowel sounds. No hepatomegaly. No rebound/guarding. No obvious abdominal masses. Musculoskeletal:  Strength and tone normal for age. Extremities/Skin: Warm and dry.  No edema. Neuro: Alert and oriented X 3. Moves all extremities spontaneously. Gait is normal.  CNII-XII grossly in tact. Psych:  Responds to questions appropriately with a normal affect.     ASSESSMENT AND PLAN:  68 y.o. year old female with  1. Hypertension Blood pressure is well controlled. Continue current medication.  2. Hyperglycemia She has never required medication for this. At last check 10/2012 A1c was 6.1. - Hemoglobin A1C, fingerstick  3. Insomnia Controlled with Ambien. And this causes no adverse effects.  4. GERD (gastroesophageal reflux disease) Asymptomatic off PPI. See #5 below.  5. Barrett's esophagus At Her last visit with me 10/2012 I noted that this is documented on her history in the computer. At that time noted that she was not on any PPI. Asked the patient about this. She said when she went for a bone density test the lady there told her that proton pump inhibitors can cause degenerative bones. She stopped the PPI.  She says that she has seen Dr. Elnoria HowardHung for her colonoscopy since then and that he was aware. He told her it was okay to stay off of PPI. She has no GERD symptoms even off of PPI.  6. Mammogram: She just recently had this and it was normal. She has these once a year.  7. screening colonoscopy: She says that she recently saw Dr. Elnoria HowardHung. This is up to date.  8. DEXA scan was performed 09/11/10. T-scores were - 1.6 and -1.5. She has been on calcium and vitamin D. She does daily weightbearing exercise.  9. Immunizations: She had tetanus in 2012. She had Pneumovax 11/08/07. Discussed getting the Prevnar 13 today and she is agreeable. She had Zostavax 11/08/07.  6. Need for prophylactic vaccination against Streptococcus pneumoniae (pneumococcus) - Pneumococcal conjugate vaccine 13-valent  Regular office visit 6 months or sooner if she needs us.  Murray HodgkinsSigned, Alorah Mcree Beth WylieDixon, [place], [facility] 05/02/2013 6:13 AM

## 2013-05-03 ENCOUNTER — Encounter: Payer: Self-pay | Admitting: *Deleted

## 2013-05-03 ENCOUNTER — Encounter: Payer: Self-pay | Admitting: Family Medicine

## 2013-06-19 ENCOUNTER — Other Ambulatory Visit: Payer: Self-pay | Admitting: Physician Assistant

## 2013-06-19 NOTE — Telephone Encounter (Signed)
Last RF 12/12/12 #30 + 2.  Last OV 05/01/13  OK refill?

## 2013-06-19 NOTE — Telephone Encounter (Signed)
rx called in

## 2013-06-19 NOTE — Telephone Encounter (Signed)
Approved for #30+2 additional refills 

## 2013-08-11 ENCOUNTER — Other Ambulatory Visit: Payer: Self-pay | Admitting: Family Medicine

## 2013-08-11 MED ORDER — METOPROLOL TARTRATE 25 MG PO TABS: 25.0000 mg | ORAL_TABLET | Freq: Two times a day (BID) | ORAL | Status: DC

## 2013-08-11 NOTE — Telephone Encounter (Signed)
Rx Refilled  

## 2013-08-14 ENCOUNTER — Telehealth: Payer: Self-pay | Admitting: *Deleted

## 2013-08-14 NOTE — Telephone Encounter (Signed)
Received PA request for Ambien from pharmacy.   Patient insurance only covers 90 per 365 days.   PA submitted for quantity limit.

## 2013-08-22 NOTE — Telephone Encounter (Signed)
Received PA determination.   PA denied.   Quantity Limits of 90/365 days.   Please advise.

## 2013-08-22 NOTE — Telephone Encounter (Signed)
Please notify patient.  She will have to pay out of pocket.

## 2013-08-22 NOTE — Telephone Encounter (Signed)
Call placed to patient and patient made aware.  

## 2013-10-25 ENCOUNTER — Other Ambulatory Visit: Payer: Medicare Other

## 2013-10-25 DIAGNOSIS — R7309 Other abnormal glucose: Secondary | ICD-10-CM

## 2013-10-25 DIAGNOSIS — Z79899 Other long term (current) drug therapy: Secondary | ICD-10-CM

## 2013-10-25 DIAGNOSIS — Z0000 Encounter for general adult medical examination without abnormal findings: Secondary | ICD-10-CM

## 2013-10-25 DIAGNOSIS — I10 Essential (primary) hypertension: Secondary | ICD-10-CM

## 2013-10-25 DIAGNOSIS — M810 Age-related osteoporosis without current pathological fracture: Secondary | ICD-10-CM

## 2013-10-25 LAB — COMPLETE METABOLIC PANEL WITH GFR
ALK PHOS: 62 U/L (ref 39–117)
ALT: 11 U/L (ref 0–35)
AST: 19 U/L (ref 0–37)
Albumin: 4.4 g/dL (ref 3.5–5.2)
BUN: 20 mg/dL (ref 6–23)
CO2: 26 meq/L (ref 19–32)
CREATININE: 0.89 mg/dL (ref 0.50–1.10)
Calcium: 9.6 mg/dL (ref 8.4–10.5)
Chloride: 101 mEq/L (ref 96–112)
GFR, EST AFRICAN AMERICAN: 76 mL/min
GFR, EST NON AFRICAN AMERICAN: 66 mL/min
GLUCOSE: 102 mg/dL — AB (ref 70–99)
Potassium: 4.3 mEq/L (ref 3.5–5.3)
Sodium: 140 mEq/L (ref 135–145)
TOTAL PROTEIN: 7.7 g/dL (ref 6.0–8.3)
Total Bilirubin: 0.4 mg/dL (ref 0.2–1.2)

## 2013-10-25 LAB — CBC WITH DIFFERENTIAL/PLATELET
BASOS ABS: 0.1 10*3/uL (ref 0.0–0.1)
Basophils Relative: 1 % (ref 0–1)
Eosinophils Absolute: 0.2 10*3/uL (ref 0.0–0.7)
Eosinophils Relative: 3 % (ref 0–5)
HEMATOCRIT: 33.6 % — AB (ref 36.0–46.0)
HEMOGLOBIN: 11.1 g/dL — AB (ref 12.0–15.0)
LYMPHS PCT: 31 % (ref 12–46)
Lymphs Abs: 1.7 10*3/uL (ref 0.7–4.0)
MCH: 32 pg (ref 26.0–34.0)
MCHC: 33 g/dL (ref 30.0–36.0)
MCV: 96.8 fL (ref 78.0–100.0)
MONO ABS: 0.5 10*3/uL (ref 0.1–1.0)
MONOS PCT: 9 % (ref 3–12)
NEUTROS ABS: 3.1 10*3/uL (ref 1.7–7.7)
Neutrophils Relative %: 56 % (ref 43–77)
Platelets: 248 10*3/uL (ref 150–400)
RBC: 3.47 MIL/uL — ABNORMAL LOW (ref 3.87–5.11)
RDW: 13.4 % (ref 11.5–15.5)
WBC: 5.6 10*3/uL (ref 4.0–10.5)

## 2013-10-25 LAB — HEMOGLOBIN A1C
Hgb A1c MFr Bld: 6.2 % — ABNORMAL HIGH (ref <5.7)
MEAN PLASMA GLUCOSE: 131 mg/dL — AB (ref <117)

## 2013-10-25 LAB — LIPID PANEL
Cholesterol: 187 mg/dL (ref 0–200)
HDL: 64 mg/dL (ref >39)
LDL Cholesterol: 100 mg/dL — ABNORMAL HIGH (ref 0–99)
TRIGLYCERIDES: 114 mg/dL (ref <150)
Total CHOL/HDL Ratio: 2.9 Ratio
VLDL: 23 mg/dL (ref 0–40)

## 2013-10-25 LAB — TSH: TSH: 3.183 u[IU]/mL (ref 0.350–4.500)

## 2013-10-26 LAB — VITAMIN D 25 HYDROXY (VIT D DEFICIENCY, FRACTURES): VIT D 25 HYDROXY: 40 ng/mL (ref 30–89)

## 2013-11-01 ENCOUNTER — Encounter: Payer: Medicare Other | Admitting: Physician Assistant

## 2013-11-01 ENCOUNTER — Ambulatory Visit: Payer: Medicare Other | Admitting: Physician Assistant

## 2013-11-08 ENCOUNTER — Encounter: Payer: Self-pay | Admitting: Physician Assistant

## 2013-11-08 ENCOUNTER — Ambulatory Visit (INDEPENDENT_AMBULATORY_CARE_PROVIDER_SITE_OTHER): Payer: Medicare Other | Admitting: Physician Assistant

## 2013-11-08 VITALS — BP 114/70 | HR 64 | Temp 98.1°F | Resp 18 | Ht 62.0 in | Wt 110.0 lb

## 2013-11-08 DIAGNOSIS — Z0000 Encounter for general adult medical examination without abnormal findings: Secondary | ICD-10-CM

## 2013-11-08 NOTE — Progress Notes (Signed)
Subjective:   Patient presents for Medicare Annual/Subsequent preventive examination.   Review Past Medical/Family/Social: All of these sections are reviewed and updated in Epic today.  Risk Factors  Current exercise habits: She is always active and never sits still. She does walk for exercise 2 days a week. She stretches at least 4 days a week and does exercises for her cervical spine. Dietary issues discussed: She eats very healthy diet.   Cardiac risk factors: Age   Depression Screen  (Note: if answer to either of the following is "Yes", a more complete depression screening is indicated)  Over the past two weeks, have you felt down, depressed or hopeless? No Over the past two weeks, have you felt little interest or pleasure in doing things? No Have you lost interest or pleasure in daily life? No Do you often feel hopeless? No Do you cry easily over simple problems? No   Activities of Daily Living  In your present state of health, do you have any difficulty performing the following activities?:  Driving? No  Managing money? No  Feeding yourself? No  Getting from bed to chair? No  Climbing a flight of stairs? No  Preparing food and eating?: No  Bathing or showering? No  Getting dressed: No  Getting to the toilet? No  Using the toilet:No  Moving around from place to place: No  In the past year have you fallen or had a near fall?:No  Are you sexually active? No  Do you have more than one partner? No   Hearing Difficulties: No  Do you often ask people to speak up or repeat themselves? No  Do you experience ringing or noises in your ears? No Do you have difficulty understanding soft or whispered voices? No  Do you feel that you have a problem with memory? No Do you often misplace items? No  Do you feel safe at home? Yes  Cognitive Testing  Alert? Yes Normal Appearance?Yes  Oriented to person? Yes Place? Yes  Time? Yes  Recall of three objects? Yes  Can perform simple  calculations? Yes  Displays appropriate judgment?Yes  Can read the correct time from a watch face?Yes   List the Names of Other Physician/Practitioners you currently use: Dr. Bernarda Caffey    Indicate any recent Medical Services you may have received from other than [facility] providers in the past year (date may be approximate).   Screening Tests / Date Colonoscopy --Dr. Hung---12/26/2011                    Zostavax --- she received this 11/08/2007 Mammogram --- she states that her last mammogram was just a little greater than one year ago. She recently received a letter that she is due to schedule followup mammogram and she plans to followup with scheduling this. Influenza Vaccine --- today I recommend influenza vaccine but she defers. Tetanus/tdap--- she did have this vaccine in 2012.     Assessment:    Annual wellness medicare exam   Plan:    During the course of the visit the patient was educated and counseled about appropriate screening and preventive services including:  Screening mammography  Colorectal cancer screening  Shingles vaccine. Prescription given to that she can get the vaccine at the pharmacy or Medicare part D.  Screen + for depression. PHQ- 9 score of 12 (moderate depression). We discussed the options of counseling versus possibly a medication. I encouraged her strongly think about the counseling. She is going through some  medical problems currently and her husband is as well Mrs. been very stressful for her. She says she will think about it. She does have Xanax to use as needed. Though she may benefit from an SSRI for her more depressive type symptoms but she wants to hold off at this time.  I aksed her to please have her cardioloist send records since we have none on file.  Diet review for nutrition referral? Yes ____ Not Indicated __x__  Patient Instructions (the written plan) was given to the patient.  Medicare Attestation  I have personally reviewed:  The patient's  medical and social history  Their use of alcohol, tobacco or illicit drugs  Their current medications and supplements  The patient's functional ability including ADLs,fall risks, home safety risks, cognitive, and hearing and visual impairment  Diet and physical activities  Evidence for depression or mood disorders  The patient's weight, height, BMI, and visual acuity have been recorded in the chart. I have made referrals, counseling, and provided education to the patient based on review of the above and I have provided the patient with a written personalized care plan for preventive services.      Review of Systems: Consitutional: No fever, chills, fatigue, night sweats, lymphadenopathy. No significant/unexplained weight changes. Eyes: No visual changes, eye redness, or discharge. ENT/Mouth: No ear pain, sore throat, nasal drainage, or sinus pain. Cardiovascular: No chest pressure,heaviness, tightness or squeezing, even with exertion. No increased shortness of breath or dyspnea on exertion.No palpitations, edema, orthopnea, PND. Respiratory: No cough, hemoptysis, SOB, or wheezing. Gastrointestinal: No anorexia, dysphagia, reflux, pain, nausea, vomiting, hematemesis, diarrhea, constipation, BRBPR, or melena. Breast: No mass, nodules, bulging, or retraction. No skin changes or inflammation. No nipple discharge. No lymphadenopathy. Genitourinary: No dysuria, hematuria, incontinence, vaginal discharge, pruritis, burning, abnormal bleeding, or pain. Musculoskeletal: No decreased ROM, No joint pain or swelling. No significant pain in neck, back, or extremities. Skin: No rash, pruritis, or concerning lesions. Neurological: No headache, dizziness, syncope, seizures, tremors, memory loss, coordination problems, or paresthesias. Psychological: No anxiety, depression, hallucinations, SI/HI. Endocrine: No polydipsia, polyphagia, polyuria, or known diabetes.No increased fatigue. No palpitations/rapid  heart rate. No significant/unexplained weight change. All other systems were reviewed and are otherwise negative.  Past Medical History  Diagnosis Date  . GERD (gastroesophageal reflux disease)   . Hypertension   . Hyperglycemia   . Anxiety   . Insomnia   . Barrett's esophagus   . NIDDM (non-insulin dependent diabetes mellitus)      Past Surgical History  Procedure Laterality Date  . Appendicitis    . Cholecystitis    . Vaginal hysterectomy    . Appendectomy    . Cholecystectomy      Home Meds:  Outpatient Prescriptions Prior to Visit  Medication Sig Dispense Refill  . calcium carbonate (OS-CAL) 600 MG TABS Take 600 mg by mouth daily.        . magnesium 30 MG tablet Take 30 mg by mouth 2 (two) times daily.        . metoprolol tartrate (LOPRESSOR) 25 MG tablet Take 1 tablet (25 mg total) by mouth 2 (two) times daily.  180 tablet  3  . vitamin B-12 (CYANOCOBALAMIN) 250 MCG tablet 250 mcg. Twice a week      . zolpidem (AMBIEN) 5 MG tablet TAKE 1 TABLET BY MOUTH AT BEDTIME AS NEEDED FOR SLEEP  30 tablet  2   No facility-administered medications prior to visit.    Allergies:  Allergies  Allergen  Reactions  . Codeine Nausea Only    Reaction: Hallucinations.  . Sulfa Antibiotics     Rash     History   Social History  . Marital Status: Married    Spouse Name: N/A    Number of Children: N/A  . Years of Education: N/A   Occupational History  . Not on file.   Social History Main Topics  . Smoking status: Never Smoker   . Smokeless tobacco: Never Used  . Alcohol Use: Yes  . Drug Use: No  . Sexual Activity: Not on file   Other Topics Concern  . Not on file   Social History Narrative   Married.    She had 2 children with prior marriage. He had children with prior marriage. She and current husband have no children together.   Walks.2 days a week.   But very active, moves nonstop doing something.    Stretches, does exercises for cervical spine at least 4 days a  week.    History reviewed. No pertinent family history.  Physical Exam: Blood pressure 114/70, pulse 64, temperature 98.1 F (36.7 C), temperature source Oral, resp. rate 18, height 5\' 2"  (1.575 m), weight 110 lb (49.896 kg)., Body mass index is 20.11 kg/(m^2). General: Well developed, well nourished, in no acute distress. HEENT: Normocephalic, atraumatic. Conjunctiva pink, sclera non-icteric. Pupils 2 mm constricting to 1 mm, round, regular, and equally reactive to light and accomodation. EOMI. Internal auditory canal clear. TMs with good cone of light and without pathology. Nasal mucosa pink. Nares are without discharge. No sinus tenderness. Oral mucosa pink.  Pharynx without exudate.   Neck: Supple. Trachea midline. No thyromegaly. Full ROM. No lymphadenopathy.No Carotid Bruits. Lungs: Clear to auscultation bilaterally without wheezes, rales, or rhonchi. Breathing is of normal effort and unlabored. Cardiovascular: RRR with S1 S2. No murmurs, rubs, or gallops. Distal pulses 2+ symmetrically. No carotid or abdominal bruits. Breast: Symmetrical. No masses. Nipples without discharge. Abdomen: Soft, non-tender, non-distended with normoactive bowel sounds. No hepatosplenomegaly or masses. No rebound/guarding. No CVA tenderness. No hernias.  Genitourinary:  Deferred. Musculoskeletal: Full range of motion and 5/5 strength throughout. Without swelling, atrophy, tenderness, crepitus, or warmth. Extremities without clubbing, cyanosis, or edema. Calves supple. Skin: Warm and moist without erythema, ecchymosis, wounds, or rash. Neuro: A+Ox3. CN II-XII grossly intact. Moves all extremities spontaneously. Full sensation throughout. Normal gait. DTR 2+ throughout upper and lower extremities. Finger to nose intact. Psych:  Responds to questions appropriately with a normal affect.   Assessment/Plan:  68 y.o. y/o female here for CPE 1. Visit for preventive health examination  A. Screening Labs: She came  fasting for screening labs on 10/25/13. CBC shows mild anemia which is stable and has been present over the past year. Otherwise CBC normal. Hemoglobin A1c is stable at 6.2. TSH normal at 3.183. Vitamin D normal at 40. Lipid panel is excellent with LDL 100. HDL excellent at 64. CMET normal.  B. Pap: She has had hysterectomy so no further Pap smear is indicated.  C. Screening Mammogram: She reports her last mammogram was just a little more than one year ago and she is aware that she is due to schedule this and she will followup with scheduling this personally.  D. DEXA/BMD: This was performed 09/11/2010. T-scores were  - 1.6  and  -1.5.  She has been on calcium and vitamin D and she does daily weightbearing exercise. Therefore can wait to repeat this scan.  E. Colorectal Cancer Screening:  Last  colonoscopy was by Dr. Elnoria HowardHung on 12/26/2011.  F. Immunizations:  Influenza: Today I recommended influenza vaccine but she defers. Tetanus:   She had tetanus vaccine in 2012. This is up to date. Pneumococcal:  She had Pneumovax-------- 11/08/2007.     She received Prevnar 13--- 05/01/2013. Zostavax: She received this 11/08/2007  She will be due for followup office visit in 6 months or sooner if needed.  Murray HodgkinsSigned, Mary Beth HackleburgDixon, [place]PA, [facility] 11/08/2013 2:34 PM

## 2013-12-09 IMAGING — CR DG CHEST 2V
2 series · 2 of 2 positions shown · non-contrast
Comparison: 06/22/2006

CLINICAL DATA: Back pain

CHEST - 2 VIEW

[w chest lat]
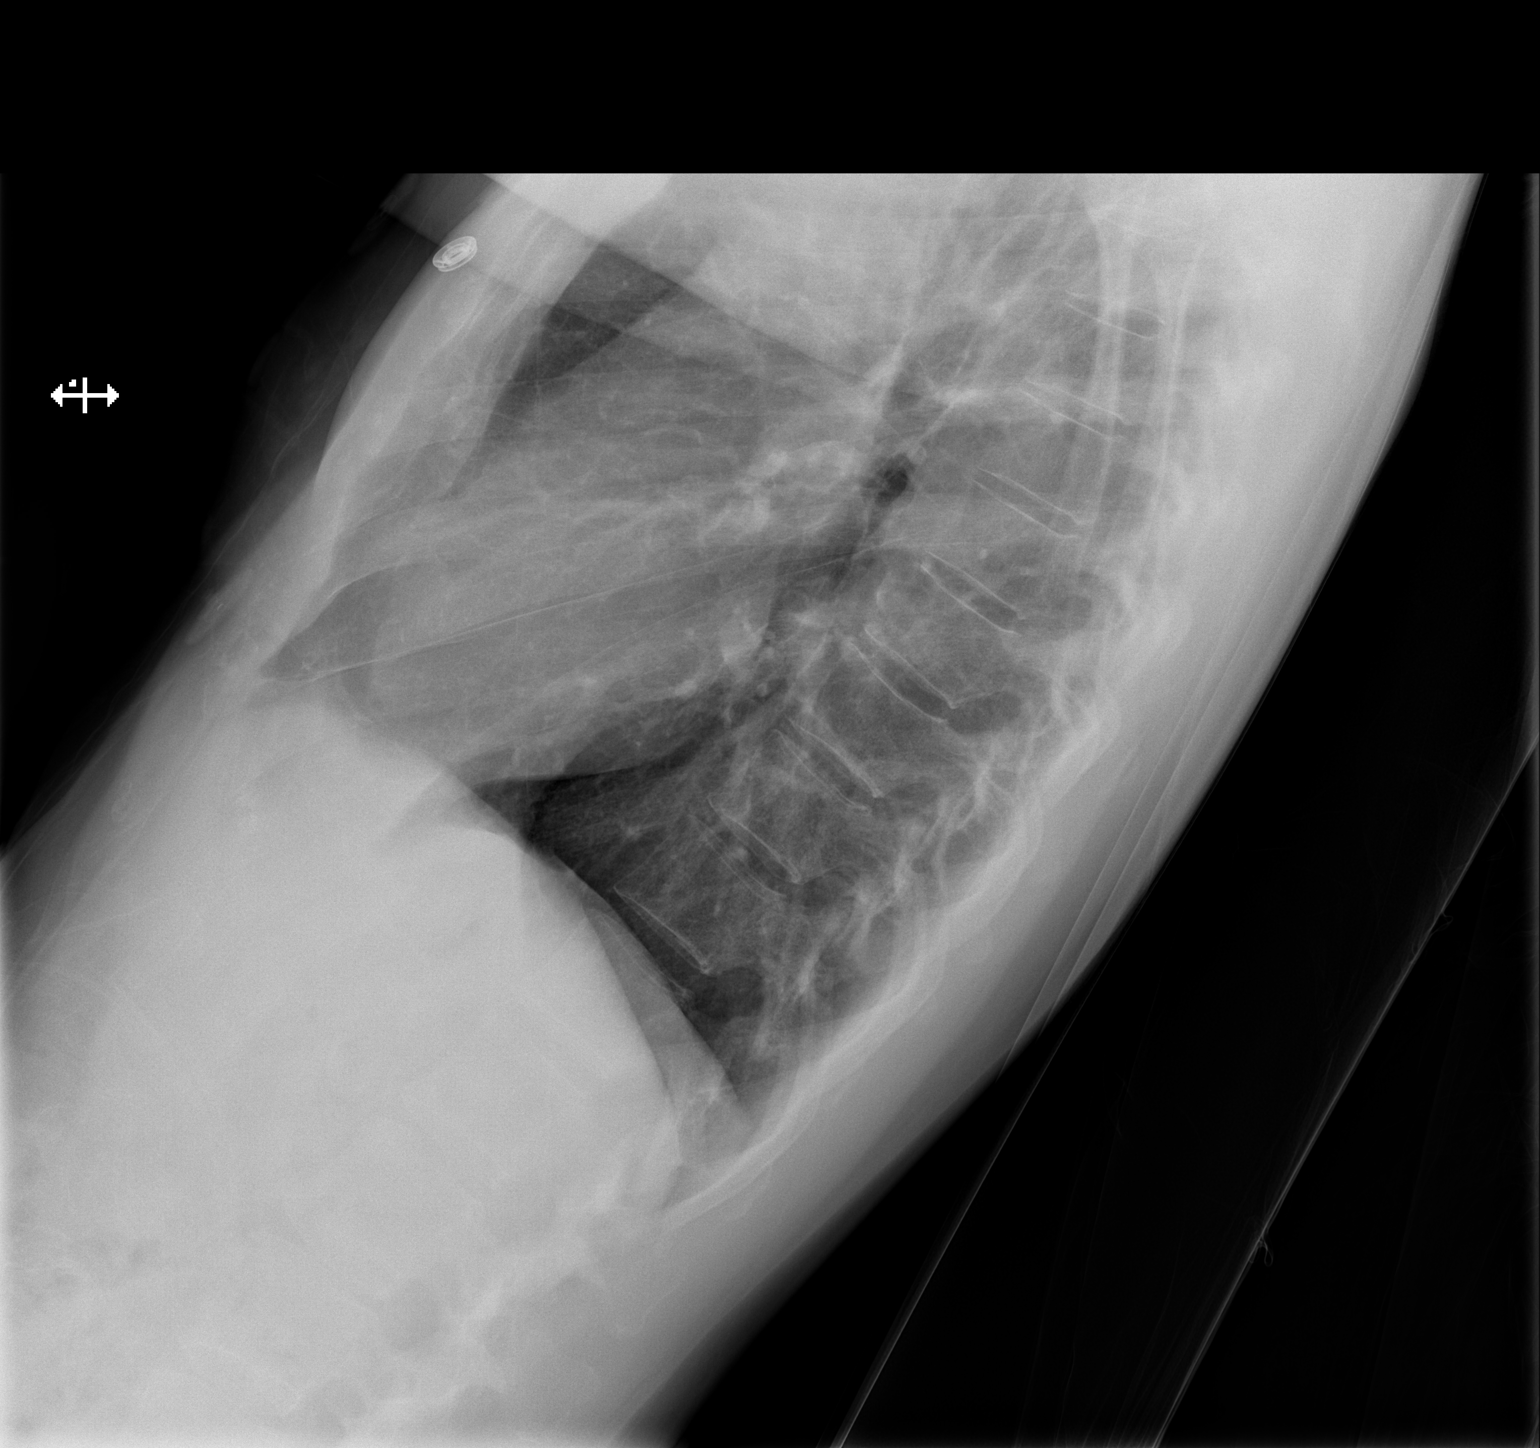

[w chest pa]
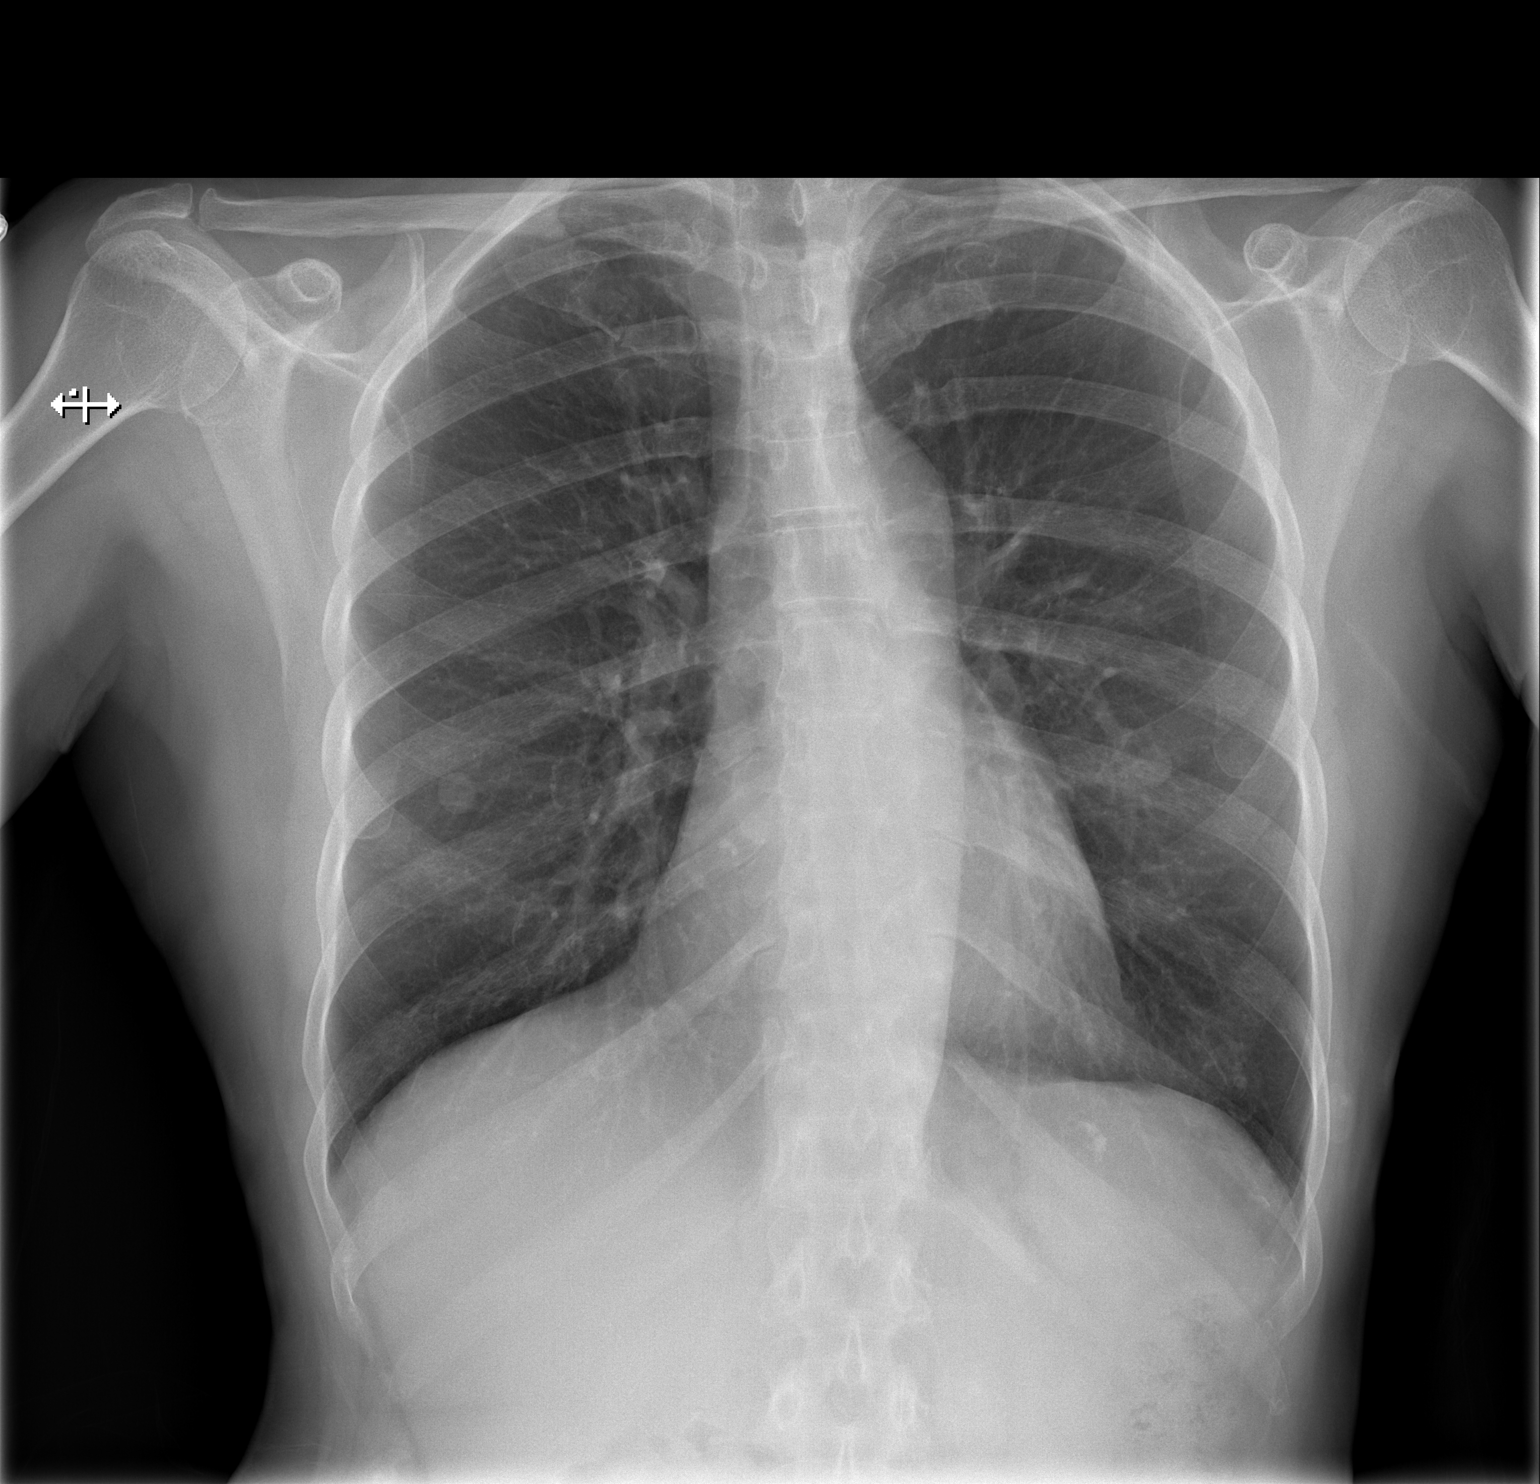

[2 of 2 positions shown; findings below may reference images not displayed]

FINDINGS: Heart size and vascularity are normal.  Aortic arch is
normal.  Lungs are clear without infiltrate or effusion.  Negative
for heart failure.

Rounded nodules in both lung bases are symmetric and compatible
with nipple shadows.
IMPRESSION: No acute cardiopulmonary abnormality.

## 2013-12-09 IMAGING — CR DG LUMBAR SPINE COMPLETE 4+V
5 series · 5 of 5 positions shown · non-contrast
Comparison: None.

CLINICAL DATA: Low back pain

LUMBAR SPINE - COMPLETE 4+ VIEW

[t l-spine a.p.]
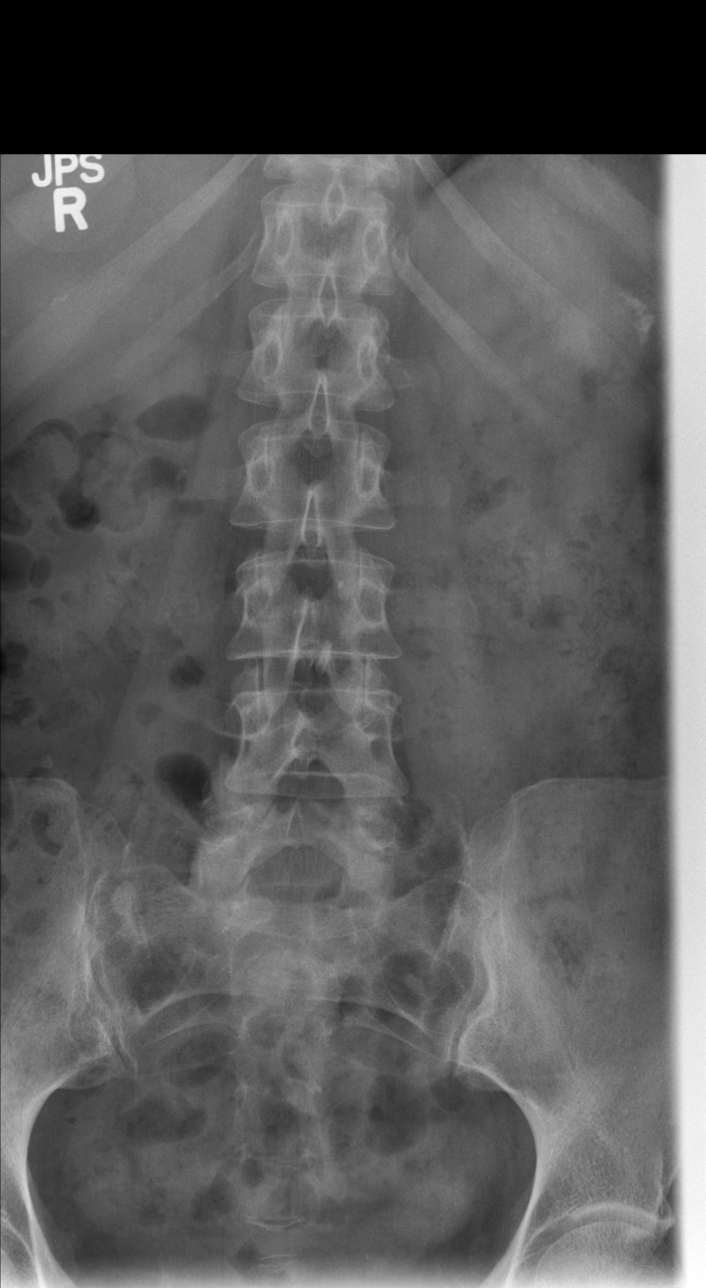

[t l-spine oblique exposure (1 of 2)]
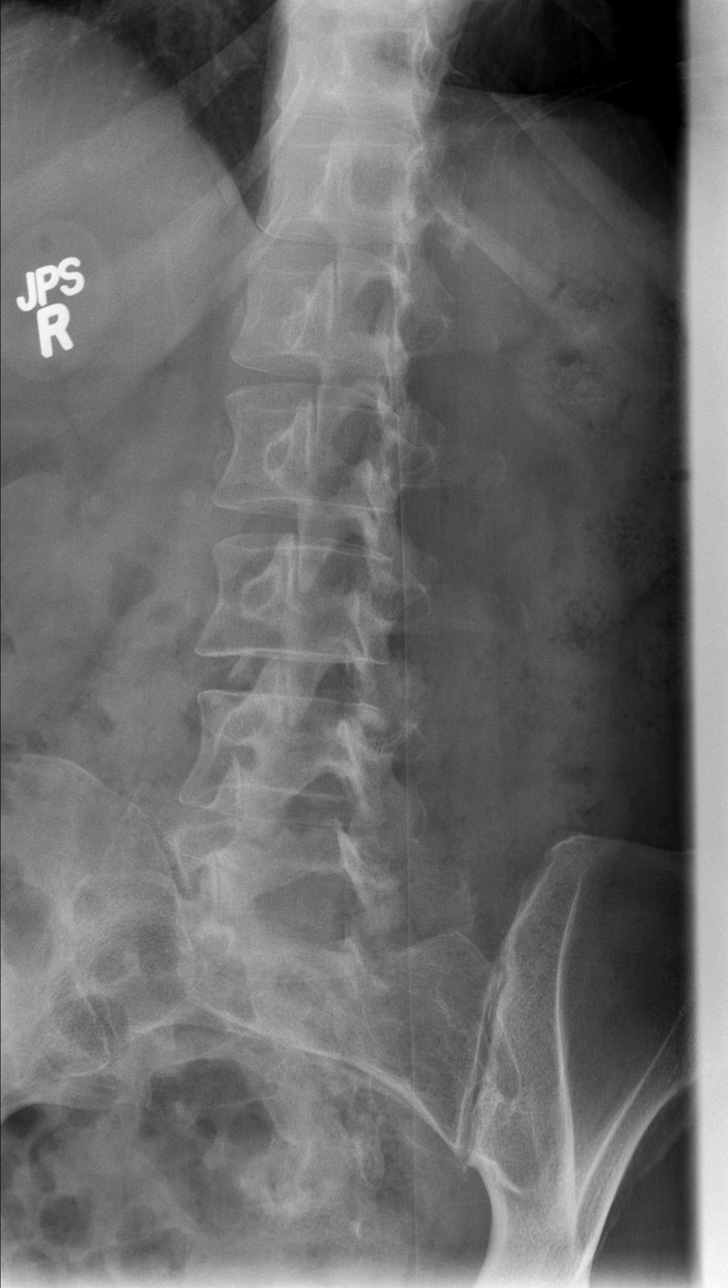

[t l-spine oblique exposure (2 of 2)]
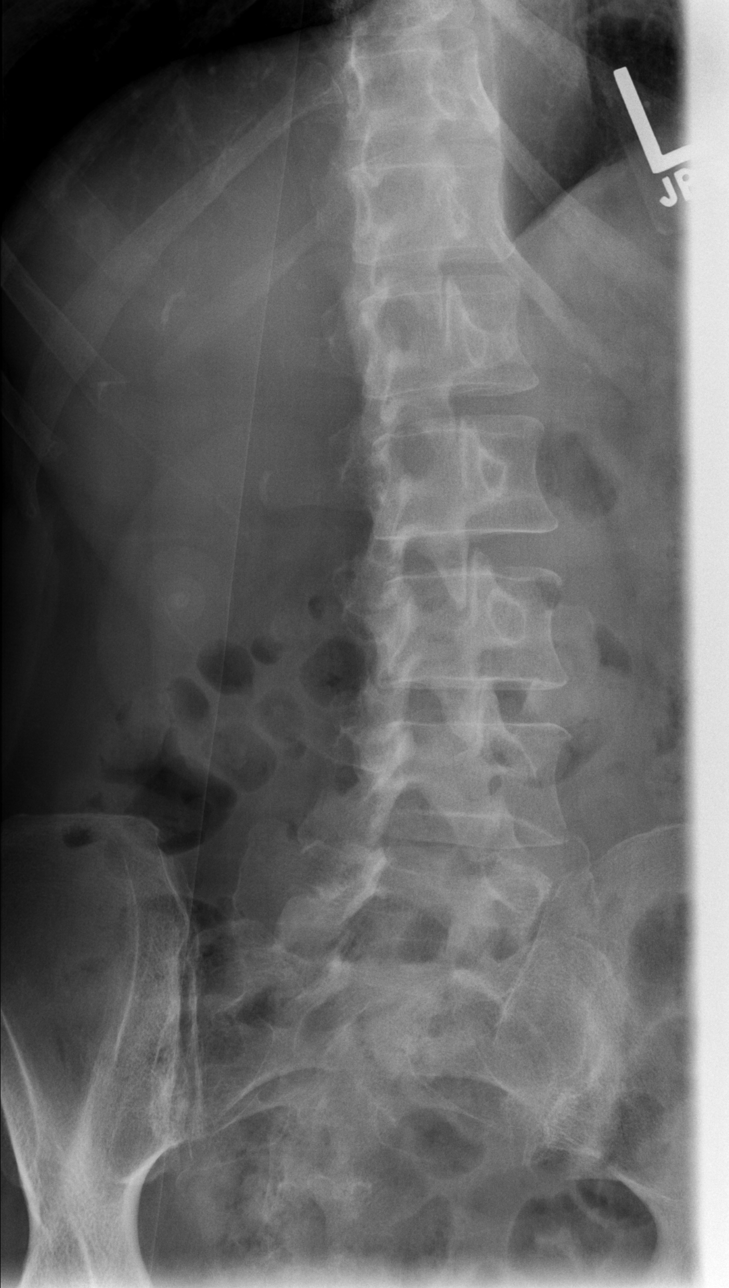

[t l-spine lat]
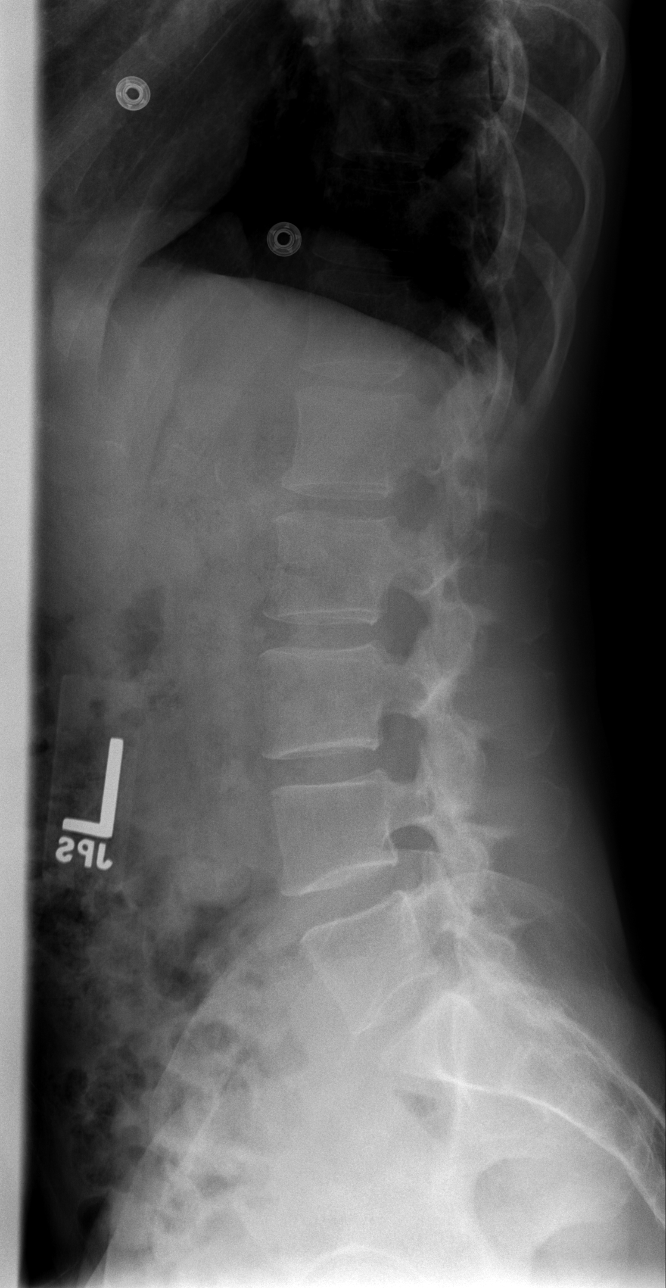

[t l-spine l5-s1 spot]
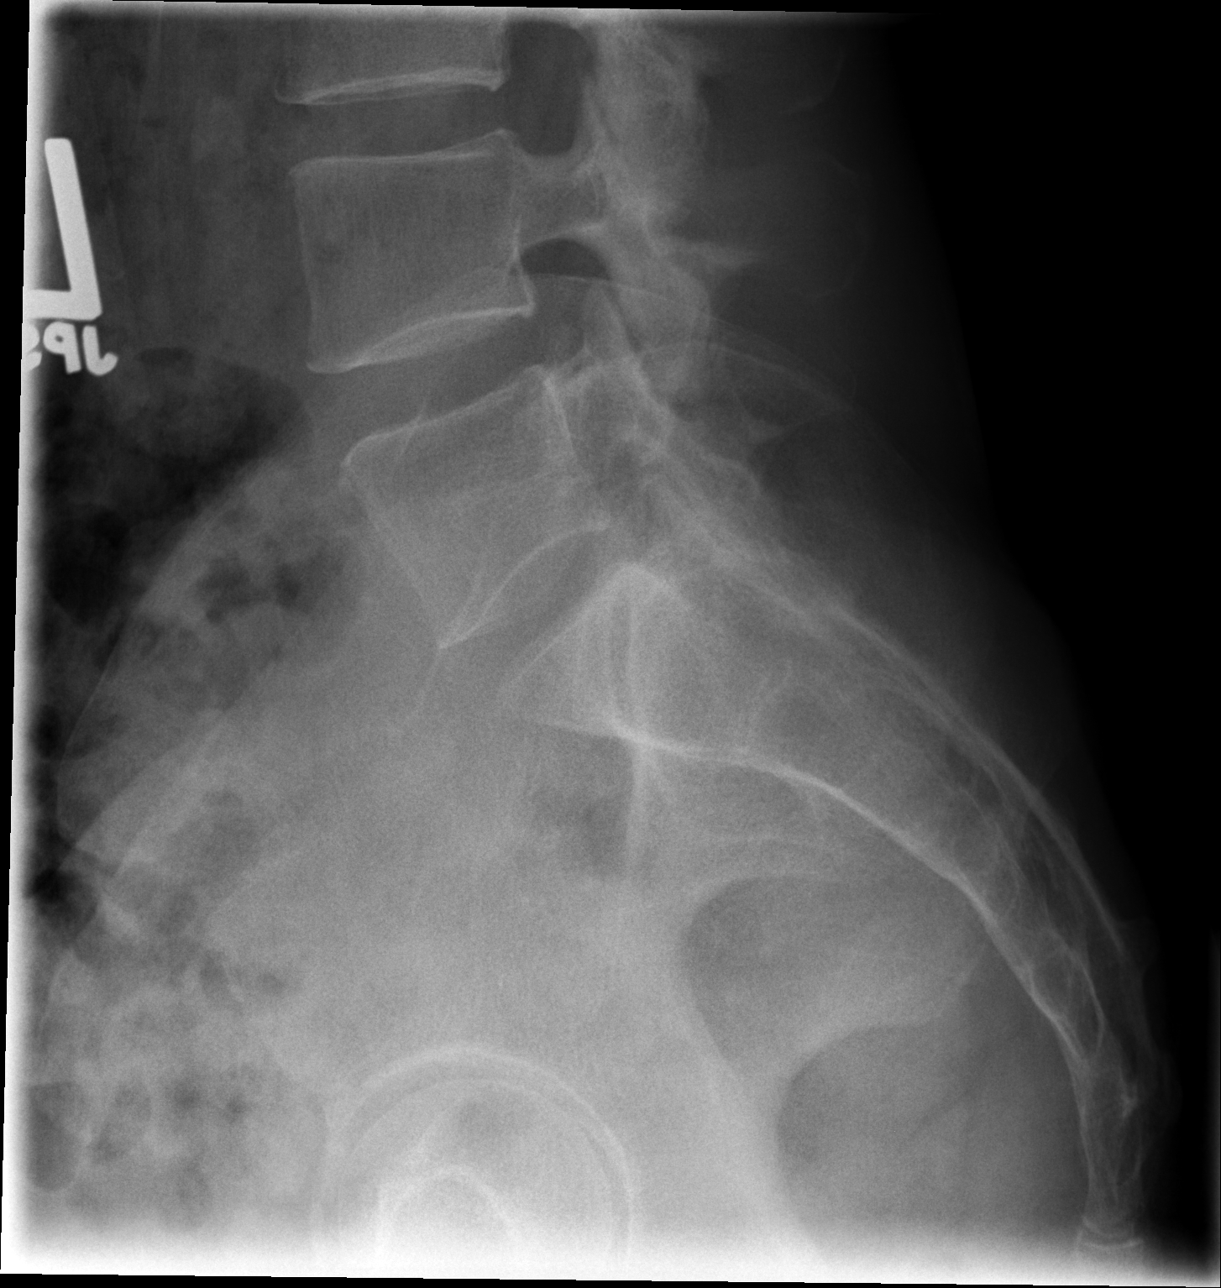

[5 of 5 positions shown; findings below may reference images not displayed]

FINDINGS: There is no evidence of lumbar spine fracture.
Alignment is normal.  Intervertebral disc spaces are maintained.
IMPRESSION: Negative.

## 2014-02-21 ENCOUNTER — Encounter: Payer: Self-pay | Admitting: Physician Assistant

## 2014-02-21 ENCOUNTER — Ambulatory Visit (INDEPENDENT_AMBULATORY_CARE_PROVIDER_SITE_OTHER): Payer: Medicare Other | Admitting: Physician Assistant

## 2014-02-21 VITALS — BP 110/60 | HR 76 | Temp 97.8°F | Resp 18 | Wt 109.0 lb

## 2014-02-21 DIAGNOSIS — R300 Dysuria: Secondary | ICD-10-CM | POA: Diagnosis not present

## 2014-02-21 DIAGNOSIS — N390 Urinary tract infection, site not specified: Secondary | ICD-10-CM | POA: Diagnosis not present

## 2014-02-21 LAB — URINALYSIS, ROUTINE W REFLEX MICROSCOPIC
BILIRUBIN URINE: NEGATIVE
GLUCOSE, UA: NEGATIVE mg/dL
Ketones, ur: NEGATIVE mg/dL
Nitrite: POSITIVE — AB
PROTEIN: NEGATIVE mg/dL
Specific Gravity, Urine: <1.005 — ABNORMAL LOW (ref 1.005–1.030)
Urobilinogen, UA: 0.2 mg/dL (ref 0.0–1.0)
pH: 5.5 (ref 5.0–8.0)

## 2014-02-21 LAB — URINALYSIS, MICROSCOPIC ONLY
CRYSTALS: NONE SEEN
Casts: NONE SEEN

## 2014-02-21 MED ORDER — CIPROFLOXACIN HCL 500 MG PO TABS: 500.0000 mg | ORAL_TABLET | Freq: Two times a day (BID) | ORAL | Status: DC

## 2014-02-21 NOTE — Progress Notes (Signed)
    Patient ID: Gabrielle Griffith MRN: [id number], DOB: November 23, 1945, 69 y.o. Date of Encounter: 02/21/2014, 1:35 PM    Chief Complaint:  Chief Complaint  Patient presents with  . x 3 days urinary urgency/pain    taking OTC AZO     HPI: 69 y.o. year old female  Reports above symptoms. Also noticing malodorous urine. No back pain, fever/chills.   At end of visit, says she has also had some nasal congestion for couple months but says she does sometimes get thick yellow mucus from right side of nose.      Home Meds:   Outpatient Prescriptions Prior to Visit  Medication Sig Dispense Refill  . calcium carbonate (OS-CAL) 600 MG TABS Take 600 mg by mouth daily.      . magnesium 30 MG tablet Take 30 mg by mouth 2 (two) times daily.      . metoprolol tartrate (LOPRESSOR) 25 MG tablet Take 1 tablet (25 mg total) by mouth 2 (two) times daily. 180 tablet 3  . omeprazole (PRILOSEC) 40 MG capsule Take 1 capsule by mouth daily.    . vitamin B-12 (CYANOCOBALAMIN) 250 MCG tablet 250 mcg. Twice a week    . zolpidem (AMBIEN) 5 MG tablet TAKE 1 TABLET BY MOUTH AT BEDTIME AS NEEDED FOR SLEEP (Patient not taking: Reported on 02/21/2014) 30 tablet 2   No facility-administered medications prior to visit.    Allergies:  Allergies  Allergen Reactions  . Codeine Nausea Only    Reaction: Hallucinations.  . Sulfa Antibiotics     Rash       Review of Systems: See HPI for pertinent ROS. All other ROS negative.    Physical Exam: Blood pressure 110/60, pulse 76, temperature 97.8 F (36.6 C), temperature source Oral, resp. rate 18, weight 109 lb (49.442 kg)., Body mass index is 19.93 kg/(m^2). General:  WNWD WF. Appears in no acute distress. HEENT: Normocephalic, atraumatic, eyes without discharge, sclera non-icteric, nares are without discharge. Bilateral auditory canals clear, TM's are without perforation, pearly grey and translucent with reflective cone of light bilaterally. Oral cavity moist,  posterior pharynx without exudate, erythema, peritonsillar abscess. No tenderness with percussion of fronatl or maxillary sinuses.   Neck: Supple. No thyromegaly. No lymphadenopathy. Lungs: Clear bilaterally to auscultation without wheezes, rales, or rhonchi. Breathing is unlabored. Heart: Regular rhythm. No murmurs, rubs, or gallops. Abdomen: Soft, non-distended with normoactive bowel sounds. No hepatomegaly. No rebound/guarding. No obvious abdominal masses. Minimal tenderness with palpation of suprapubic region only.  Msk:  Strength and tone normal for age. No tenderness with percussion of costophrenic angles bilaterally.  Extremities/Skin: Warm and dry. Neuro: Alert and oriented X 3. Moves all extremities spontaneously. Gait is normal. CNII-XII grossly in tact. Psych:  Responds to questions appropriately with a normal affect.     ASSESSMENT AND PLAN:  69 y.o. year old female with  1. Urinary tract infection without hematuria, site unspecified - ciprofloxacin (CIPRO) 500 MG tablet; Take 1 tablet (500 mg total) by mouth 2 (two) times daily.  Dispense: 14 tablet; Refill: 0  2. Difficult or painful urination - Urinalysis, Routine w reflex microscopic  3. URI vs allergic rhinitis-- Told her to take decongestatn. F/U if symptoms do not resolve after completion of cipro--then will consider additional abx.   Signed, [address]Mary Beth WartburgDixon, [place]PA, [facility] 02/21/2014 1:35 PM

## 2014-03-14 DIAGNOSIS — H524 Presbyopia: Secondary | ICD-10-CM | POA: Diagnosis not present

## 2014-04-16 ENCOUNTER — Encounter: Payer: Self-pay | Admitting: Physician Assistant

## 2014-04-16 ENCOUNTER — Ambulatory Visit (INDEPENDENT_AMBULATORY_CARE_PROVIDER_SITE_OTHER): Payer: Medicare Other | Admitting: Physician Assistant

## 2014-04-16 VITALS — BP 114/80 | HR 64 | Temp 97.9°F | Resp 18 | Wt 105.0 lb

## 2014-04-16 DIAGNOSIS — J988 Other specified respiratory disorders: Secondary | ICD-10-CM | POA: Diagnosis not present

## 2014-04-16 DIAGNOSIS — B9689 Other specified bacterial agents as the cause of diseases classified elsewhere: Principal | ICD-10-CM

## 2014-04-16 MED ORDER — AZITHROMYCIN 250 MG PO TABS: ORAL_TABLET | ORAL | Status: DC

## 2014-04-16 NOTE — Progress Notes (Signed)
    Patient ID: Gabrielle Griffith MRN: [id number], DOB: 1945/08/18, 69 y.o. Date of Encounter: 04/16/2014, 3:12 PM    Chief Complaint:  Chief Complaint  Patient presents with  . sick x 1 1/2 weeks    cough, congestion     HPI: 69 y.o. year old female presents with above complaints. Says that she has mostly chest congestion but has had a little bit of nasal congestion also. Had no significant sore throat or ear pain and no fevers or chills.     Home Meds:   Outpatient Prescriptions Prior to Visit  Medication Sig Dispense Refill  . calcium carbonate (OS-CAL) 600 MG TABS Take 600 mg by mouth daily.      . magnesium 30 MG tablet Take 30 mg by mouth 2 (two) times daily.      . metoprolol tartrate (LOPRESSOR) 25 MG tablet Take 1 tablet (25 mg total) by mouth 2 (two) times daily. 180 tablet 3  . omeprazole (PRILOSEC) 40 MG capsule Take 1 capsule by mouth daily.    . vitamin B-12 (CYANOCOBALAMIN) 250 MCG tablet 250 mcg. Twice a week    . zolpidem (AMBIEN) 5 MG tablet TAKE 1 TABLET BY MOUTH AT BEDTIME AS NEEDED FOR SLEEP (Patient not taking: Reported on 02/21/2014) 30 tablet 2  . ciprofloxacin (CIPRO) 500 MG tablet Take 1 tablet (500 mg total) by mouth 2 (two) times daily. 14 tablet 0   No facility-administered medications prior to visit.    Allergies:  Allergies  Allergen Reactions  . Codeine Nausea Only    Reaction: Hallucinations.  . Sulfa Antibiotics     Rash       Review of Systems: See HPI for pertinent ROS. All other ROS negative.    Physical Exam: Blood pressure 114/80, pulse 64, temperature 97.9 F (36.6 C), temperature source Oral, resp. rate 18, weight 105 lb (47.628 kg), SpO2 98 %., Body mass index is 19.2 kg/(m^2). General: WNWD Female.  Appears in no acute distress. HEENT: Normocephalic, atraumatic, eyes without discharge, sclera non-icteric, nares are without discharge. Bilateral auditory canals clear, TM's are without perforation, pearly grey and translucent with  reflective cone of light bilaterally. Oral cavity moist, posterior pharynx without exudate, erythema, peritonsillar abscess. No tenderness with percussion of frontal or maxillary sinuses bilaterally.  Neck: Supple. No thyromegaly. No lymphadenopathy. Lungs: Clear bilaterally to auscultation without wheezes, rales, or rhonchi. Breathing is unlabored. Heart: Regular rhythm. No murmurs, rubs, or gallops. Msk:  Strength and tone normal for age. Extremities/Skin: Warm and dry. Neuro: Alert and oriented X 3. Moves all extremities spontaneously. Gait is normal. CNII-XII grossly in tact. Psych:  Responds to questions appropriately with a normal affect.     ASSESSMENT AND PLAN:  69 y.o. year old female with  1. Bacterial respiratory infection Take antibiotic as directed and complete all of it. Can also use over-the-counter medications as needed for symptom relief. Follow-up if symptoms do not resolve within 1 week after completion of antibiotic. - azithromycin (ZITHROMAX) 250 MG tablet; Day 1: Take 2 daily.  Days 2-5: Take 1 daily.  Dispense: 6 tablet; Refill: 0   Signed, [address] [place], [place], [facility] 04/16/2014 3:12 PM

## 2014-07-30 ENCOUNTER — Other Ambulatory Visit: Payer: Self-pay

## 2014-08-10 ENCOUNTER — Other Ambulatory Visit: Payer: Self-pay | Admitting: Physician Assistant

## 2014-08-13 NOTE — Telephone Encounter (Signed)
Cipro refill denied.  NTBS

## 2014-08-22 ENCOUNTER — Other Ambulatory Visit: Payer: Self-pay | Admitting: Family Medicine

## 2014-09-27 DIAGNOSIS — M9913 Subluxation complex (vertebral) of lumbar region: Secondary | ICD-10-CM | POA: Diagnosis not present

## 2014-09-27 DIAGNOSIS — M5417 Radiculopathy, lumbosacral region: Secondary | ICD-10-CM | POA: Diagnosis not present

## 2014-10-01 DIAGNOSIS — M5417 Radiculopathy, lumbosacral region: Secondary | ICD-10-CM | POA: Diagnosis not present

## 2014-10-01 DIAGNOSIS — M9913 Subluxation complex (vertebral) of lumbar region: Secondary | ICD-10-CM | POA: Diagnosis not present

## 2014-10-02 DIAGNOSIS — M9913 Subluxation complex (vertebral) of lumbar region: Secondary | ICD-10-CM | POA: Diagnosis not present

## 2014-10-02 DIAGNOSIS — M5417 Radiculopathy, lumbosacral region: Secondary | ICD-10-CM | POA: Diagnosis not present

## 2014-10-04 DIAGNOSIS — M5417 Radiculopathy, lumbosacral region: Secondary | ICD-10-CM | POA: Diagnosis not present

## 2014-10-04 DIAGNOSIS — M9913 Subluxation complex (vertebral) of lumbar region: Secondary | ICD-10-CM | POA: Diagnosis not present

## 2014-10-09 DIAGNOSIS — M9913 Subluxation complex (vertebral) of lumbar region: Secondary | ICD-10-CM | POA: Diagnosis not present

## 2014-10-09 DIAGNOSIS — M5417 Radiculopathy, lumbosacral region: Secondary | ICD-10-CM | POA: Diagnosis not present

## 2014-10-10 DIAGNOSIS — M5417 Radiculopathy, lumbosacral region: Secondary | ICD-10-CM | POA: Diagnosis not present

## 2014-10-10 DIAGNOSIS — M9913 Subluxation complex (vertebral) of lumbar region: Secondary | ICD-10-CM | POA: Diagnosis not present

## 2014-10-11 DIAGNOSIS — M5417 Radiculopathy, lumbosacral region: Secondary | ICD-10-CM | POA: Diagnosis not present

## 2014-10-11 DIAGNOSIS — M9913 Subluxation complex (vertebral) of lumbar region: Secondary | ICD-10-CM | POA: Diagnosis not present

## 2014-10-15 DIAGNOSIS — M5417 Radiculopathy, lumbosacral region: Secondary | ICD-10-CM | POA: Diagnosis not present

## 2014-10-15 DIAGNOSIS — M9913 Subluxation complex (vertebral) of lumbar region: Secondary | ICD-10-CM | POA: Diagnosis not present

## 2014-10-19 DIAGNOSIS — M5417 Radiculopathy, lumbosacral region: Secondary | ICD-10-CM | POA: Diagnosis not present

## 2014-10-19 DIAGNOSIS — M9913 Subluxation complex (vertebral) of lumbar region: Secondary | ICD-10-CM | POA: Diagnosis not present

## 2014-10-22 DIAGNOSIS — M5417 Radiculopathy, lumbosacral region: Secondary | ICD-10-CM | POA: Diagnosis not present

## 2014-10-22 DIAGNOSIS — M9913 Subluxation complex (vertebral) of lumbar region: Secondary | ICD-10-CM | POA: Diagnosis not present

## 2014-10-25 DIAGNOSIS — M5417 Radiculopathy, lumbosacral region: Secondary | ICD-10-CM | POA: Diagnosis not present

## 2014-10-25 DIAGNOSIS — M9913 Subluxation complex (vertebral) of lumbar region: Secondary | ICD-10-CM | POA: Diagnosis not present

## 2014-10-29 DIAGNOSIS — M9913 Subluxation complex (vertebral) of lumbar region: Secondary | ICD-10-CM | POA: Diagnosis not present

## 2014-10-29 DIAGNOSIS — M5417 Radiculopathy, lumbosacral region: Secondary | ICD-10-CM | POA: Diagnosis not present

## 2014-11-08 DIAGNOSIS — M5417 Radiculopathy, lumbosacral region: Secondary | ICD-10-CM | POA: Diagnosis not present

## 2014-11-08 DIAGNOSIS — M9913 Subluxation complex (vertebral) of lumbar region: Secondary | ICD-10-CM | POA: Diagnosis not present

## 2014-11-12 ENCOUNTER — Ambulatory Visit (INDEPENDENT_AMBULATORY_CARE_PROVIDER_SITE_OTHER): Payer: Medicare Other | Admitting: Physician Assistant

## 2014-11-12 ENCOUNTER — Encounter: Payer: Self-pay | Admitting: Physician Assistant

## 2014-11-12 VITALS — BP 102/62 | HR 68 | Temp 97.9°F | Resp 18 | Ht 61.0 in | Wt 109.0 lb

## 2014-11-12 DIAGNOSIS — Z0000 Encounter for general adult medical examination without abnormal findings: Secondary | ICD-10-CM | POA: Diagnosis not present

## 2014-11-12 DIAGNOSIS — Z1329 Encounter for screening for other suspected endocrine disorder: Secondary | ICD-10-CM | POA: Diagnosis not present

## 2014-11-12 DIAGNOSIS — R739 Hyperglycemia, unspecified: Secondary | ICD-10-CM | POA: Diagnosis not present

## 2014-11-12 DIAGNOSIS — K22719 Barrett's esophagus with dysplasia, unspecified: Secondary | ICD-10-CM

## 2014-11-12 DIAGNOSIS — K219 Gastro-esophageal reflux disease without esophagitis: Secondary | ICD-10-CM

## 2014-11-12 DIAGNOSIS — N390 Urinary tract infection, site not specified: Secondary | ICD-10-CM | POA: Diagnosis not present

## 2014-11-12 DIAGNOSIS — G4700 Insomnia, unspecified: Secondary | ICD-10-CM

## 2014-11-12 DIAGNOSIS — R300 Dysuria: Secondary | ICD-10-CM | POA: Diagnosis not present

## 2014-11-12 DIAGNOSIS — I10 Essential (primary) hypertension: Secondary | ICD-10-CM | POA: Diagnosis not present

## 2014-11-12 LAB — CBC WITH DIFFERENTIAL/PLATELET
Basophils Absolute: 0 10*3/uL (ref 0.0–0.1)
Basophils Relative: 0 % (ref 0–1)
Eosinophils Absolute: 0.2 10*3/uL (ref 0.0–0.7)
Eosinophils Relative: 4 % (ref 0–5)
HEMATOCRIT: 37 % (ref 36.0–46.0)
HEMOGLOBIN: 12.4 g/dL (ref 12.0–15.0)
LYMPHS PCT: 23 % (ref 12–46)
Lymphs Abs: 1.4 10*3/uL (ref 0.7–4.0)
MCH: 33.6 pg (ref 26.0–34.0)
MCHC: 33.5 g/dL (ref 30.0–36.0)
MCV: 100.3 fL — ABNORMAL HIGH (ref 78.0–100.0)
MONO ABS: 0.5 10*3/uL (ref 0.1–1.0)
MONOS PCT: 8 % (ref 3–12)
MPV: 8.7 fL (ref 8.6–12.4)
NEUTROS ABS: 4 10*3/uL (ref 1.7–7.7)
Neutrophils Relative %: 65 % (ref 43–77)
Platelets: 212 10*3/uL (ref 150–400)
RBC: 3.69 MIL/uL — ABNORMAL LOW (ref 3.87–5.11)
RDW: 13.6 % (ref 11.5–15.5)
WBC: 6.1 10*3/uL (ref 4.0–10.5)

## 2014-11-12 LAB — URINALYSIS, ROUTINE W REFLEX MICROSCOPIC
BILIRUBIN URINE: NEGATIVE
GLUCOSE, UA: NEGATIVE
Ketones, ur: NEGATIVE
Nitrite: POSITIVE — AB
PH: 5.5 (ref 5.0–8.0)

## 2014-11-12 LAB — URINALYSIS, MICROSCOPIC ONLY
CRYSTALS: NONE SEEN [HPF]
Casts: NONE SEEN [LPF]
Yeast: NONE SEEN [HPF]

## 2014-11-12 LAB — HEMOGLOBIN A1C
Hgb A1c MFr Bld: 6.2 % — ABNORMAL HIGH (ref <5.7)
MEAN PLASMA GLUCOSE: 131 mg/dL — AB (ref <117)

## 2014-11-12 MED ORDER — CIPROFLOXACIN HCL 500 MG PO TABS: 500.0000 mg | ORAL_TABLET | Freq: Two times a day (BID) | ORAL | Status: DC

## 2014-11-12 NOTE — Progress Notes (Signed)
Subjective:   Patient presents for Medicare Annual/Subsequent preventive examination.   Review Past Medical/Family/Social: All of these sections are reviewed and updated in Epic today.  Risk Factors  Current exercise habits: She is always active and never sits still. She does walk for exercise 2 days a week. She stretches at least 4 days a week and does exercises for her cervical spine. Dietary issues discussed: She eats very healthy diet.   Cardiac risk factors: Age   Depression Screen  (Note: if answer to either of the following is "Yes", a more complete depression screening is indicated)  Over the past two weeks, have you felt down, depressed or hopeless? No Over the past two weeks, have you felt little interest or pleasure in doing things? No Have you lost interest or pleasure in daily life? No Do you often feel hopeless? No Do you cry easily over simple problems? No   Activities of Daily Living  In your present state of health, do you have any difficulty performing the following activities?:  Driving? No  Managing money? No  Feeding yourself? No  Getting from bed to chair? No  Climbing a flight of stairs? No  Preparing food and eating?: No  Bathing or showering? No  Getting dressed: No  Getting to the toilet? No  Using the toilet:No  Moving around from place to place: No  In the past year have you fallen or had a near fall?:No  Are you sexually active? No  Do you have more than one partner? No   Hearing Difficulties: No  Do you often ask people to speak up or repeat themselves? No  Do you experience ringing or noises in your ears? No Do you have difficulty understanding soft or whispered voices? No  Do you feel that you have a problem with memory? No Do you often misplace items? No  Do you feel safe at home? Yes  Cognitive Testing  Alert? Yes Normal Appearance?Yes  Oriented to person? Yes Place? Yes  Time? Yes  Recall of three objects? Yes  Can perform simple  calculations? Yes  Displays appropriate judgment?Yes  Can read the correct time from a watch face?Yes   List the Names of Other Physician/Practitioners you currently use: Dr. Bernarda Caffey  She also sees chiropractor, Dr. Allison Quarry, for her neck and spine   Indicate any recent Medical Services you may have received from other than [facility] providers in the past year (date may be approximate).   Screening Tests / Date Colonoscopy --Dr. Hung---12/26/2011                    Mammogram --- she states that her last mammogram was just a little greater than one year ago. She recently received a letter that she is due to schedule followup mammogram and she plans to followup with scheduling this. F. Immunizations:  Influenza: Today I recommended influenza vaccine but she defers. Tetanus:   She had tetanus vaccine in 2012. This is up to date. Pneumococcal:  She had Pneumovax-------- 11/08/2007.     She received Prevnar 13--- 05/01/2013. Zostavax: She received this 11/08/2007     Assessment:    Annual wellness medicare exam   Plan:    During the course of the visit the patient was educated and counseled about appropriate screening and preventive services including:  Screening mammography  Colorectal cancer screening  Shingles vaccine. Prescription given to that she can get the vaccine at the pharmacy or Medicare part D.  Screen +  for depression. PHQ- 9 score of 12 (moderate depression). We discussed the options of counseling versus possibly a medication. I encouraged her strongly think about the counseling. She is going through some medical problems currently and her husband is as well Mrs. been very stressful for her. She says she will think about it. She does have Xanax to use as needed. Though she may benefit from an SSRI for her more depressive type symptoms but she wants to hold off at this time.  I aksed her to please have her cardioloist send records since we have none on file.  Diet review for  nutrition referral? Yes ____ Not Indicated __x__  Patient Instructions (the written plan) was given to the patient.  Medicare Attestation  I have personally reviewed:  The patient's medical and social history  Their use of alcohol, tobacco or illicit drugs  Their current medications and supplements  The patient's functional ability including ADLs,fall risks, home safety risks, cognitive, and hearing and visual impairment  Diet and physical activities  Evidence for depression or mood disorders  The patient's weight, height, BMI, and visual acuity have been recorded in the chart. I have made referrals, counseling, and provided education to the patient based on review of the above and I have provided the patient with a written personalized care plan for preventive services.      Review of Systems: Consitutional: No fever, chills, fatigue, night sweats, lymphadenopathy. No significant/unexplained weight changes. Eyes: No visual changes, eye redness, or discharge. ENT/Mouth: No ear pain, sore throat, nasal drainage, or sinus pain. Cardiovascular: No chest pressure,heaviness, tightness or squeezing, even with exertion. No increased shortness of breath or dyspnea on exertion.No palpitations, edema, orthopnea, PND. Respiratory: No cough, hemoptysis, SOB, or wheezing. Gastrointestinal: No anorexia, dysphagia, reflux, pain, nausea, vomiting, hematemesis, diarrhea, constipation, BRBPR, or melena. Breast: No mass, nodules, bulging, or retraction. No skin changes or inflammation. No nipple discharge. No lymphadenopathy. Genitourinary: No dysuria, hematuria, incontinence, vaginal discharge, pruritis, burning, abnormal bleeding, or pain. Musculoskeletal: No decreased ROM, No joint pain or swelling. No significant pain in neck, back, or extremities. Skin: No rash, pruritis, or concerning lesions. Neurological: No headache, dizziness, syncope, seizures, tremors, memory loss, coordination problems, or  paresthesias. Psychological: No anxiety, depression, hallucinations, SI/HI. Endocrine: No polydipsia, polyphagia, polyuria, or known diabetes.No increased fatigue. No palpitations/rapid heart rate. No significant/unexplained weight change. All other systems were reviewed and are otherwise negative.  Past Medical History  Diagnosis Date  . GERD (gastroesophageal reflux disease)   . Hypertension   . Hyperglycemia   . Anxiety   . Insomnia   . Barrett's esophagus   . NIDDM (non-insulin dependent diabetes mellitus)      Past Surgical History  Procedure Laterality Date  . Appendicitis    . Cholecystitis    . Vaginal hysterectomy    . Appendectomy    . Cholecystectomy      Home Meds:  Outpatient Prescriptions Prior to Visit  Medication Sig Dispense Refill  . calcium carbonate (OS-CAL) 600 MG TABS Take 600 mg by mouth daily.      . magnesium 30 MG tablet Take 30 mg by mouth 2 (two) times daily.      . metoprolol tartrate (LOPRESSOR) 25 MG tablet TAKE 1 TABLET TWICE A DAY 180 tablet 1  . omeprazole (PRILOSEC) 40 MG capsule Take 1 capsule by mouth daily.    . vitamin B-12 (CYANOCOBALAMIN) 250 MCG tablet 250 mcg. Twice a week    . azithromycin (ZITHROMAX) 250 MG  tablet Day 1: Take 2 daily.  Days 2-5: Take 1 daily. 6 tablet 0  . zolpidem (AMBIEN) 5 MG tablet TAKE 1 TABLET BY MOUTH AT BEDTIME AS NEEDED FOR SLEEP (Patient not taking: Reported on 02/21/2014) 30 tablet 2   No facility-administered medications prior to visit.    Allergies:  Allergies  Allergen Reactions  . Codeine Nausea Only    Reaction: Hallucinations.  . Sulfa Antibiotics     Rash     Social History   Social History  . Marital Status: Married    Spouse Name: N/A  . Number of Children: N/A  . Years of Education: N/A   Occupational History  . Not on file.   Social History Main Topics  . Smoking status: Never Smoker   . Smokeless tobacco: Never Used  . Alcohol Use: Yes  . Drug Use: No  . Sexual  Activity: Not on file   Other Topics Concern  . Not on file   Social History Narrative   Married.    She had 2 children with prior marriage. He had children with prior marriage. She and current husband have no children together.   Walks.2 days a week.   But very active, moves nonstop doing something.    Stretches, does exercises for cervical spine at least 4 days a week.    History reviewed. No pertinent family history.  Physical Exam: Blood pressure 102/62, pulse 68, temperature 97.9 F (36.6 C), temperature source Oral, resp. rate 18, height  (1.549 m), weight 109 lb (49.442 kg)., Body mass index is 20.61 kg/(m^2). General: Well developed, well nourished, in no acute distress. HEENT: Normocephalic, atraumatic. Conjunctiva pink, sclera non-icteric. Pupils 2 mm constricting to 1 mm, round, regular, and equally reactive to light and accomodation. EOMI. Internal auditory canal clear. TMs with good cone of light and without pathology. Nasal mucosa pink. Nares are without discharge. No sinus tenderness. Oral mucosa pink.  Pharynx without exudate.   Neck: Supple. Trachea midline. No thyromegaly. Full ROM. No lymphadenopathy.No Carotid Bruits. Lungs: Clear to auscultation bilaterally without wheezes, rales, or rhonchi. Breathing is of normal effort and unlabored. Cardiovascular: RRR with S1 S2. No murmurs, rubs, or gallops. Distal pulses 2+ symmetrically. No carotid or abdominal bruits. Breast: Symmetrical. No masses. Nipples without discharge. Abdomen: Soft, non-tender, non-distended with normoactive bowel sounds. No hepatosplenomegaly or masses. No rebound/guarding. No CVA tenderness. No hernias.  Genitourinary:  Deferred.She has had complete hysterectomy. Musculoskeletal: Full range of motion and 5/5 strength throughout. Without swelling, atrophy, tenderness, crepitus, or warmth. Extremities without clubbing, cyanosis, or edema. Calves supple. Skin: Warm and moist without erythema,  ecchymosis, wounds, or rash. Neuro: A+Ox3. CN II-XII grossly intact. Moves all extremities spontaneously. Full sensation throughout. Normal gait. DTR 2+ throughout upper and lower extremities. Finger to nose intact. Psych:  Responds to questions appropriately with a normal affect.   Assessment/Plan:  69 y.o. y/o female here for CPE 1. Visit for preventive health examination  A. Screening Labs: She is fasting---will check labs now  B. Pap: She has had hysterectomy so no further Pap smear is indicated.  C. Screening Mammogram: She reports her last mammogram was just a little more than one year ago and she is aware that she is due to schedule this and she will followup with scheduling this personally.  D. DEXA/BMD: This was performed 09/11/2010. T-scores were  - 1.6  and  -1.5.  She has been on calcium and vitamin D and she does daily weightbearing exercise.  11/12/2014----I ORDERED MAMMO AND DEXA---FOR THEM TO BE DONE SAME TIME/SAME DAY-----------------------------  E. Colorectal Cancer Screening:  Last colonoscopy was by Dr. Elnoria Howard on 12/26/2011. Pt reports this was normal and that she was instructed to repeat 10 years.  F. Immunizations:  Influenza: Today I recommended influenza vaccine but she defers. Tetanus:   She had tetanus vaccine in 2012. This is up to date. Pneumococcal:  She had Pneumovax-------- 11/08/2007.     She received Prevnar 13--- 05/01/2013. Zostavax: She received this 11/08/2007    1. Visit for preventive health examination - CBC with Differential/Platelet - COMPLETE METABOLIC PANEL WITH GFR - Lipid panel - TSH - Vit D  25 hydroxy (rtn osteoporosis monitoring) - MM Digital Screening; Future - DG Bone Density; Future  2. Urinary tract infection, site not specified She reports she has been experiencing dysuria. UA c/w UTI - ciprofloxacin (CIPRO) 500 MG tablet; Take 1 tablet (500 mg total) by mouth 2 (two) times daily.  Dispense: 14 tablet; Refill: 0  3. Burning  with urination - Urinalysis, Routine w reflex microscopic (not at [facility])  4. Essential hypertension  5. Hyperglycemia - Hemoglobin A1c  6. Gastroesophageal reflux disease, esophagitis presence not specified  7. Insomnia  8. Barrett's esophagus with dysplasia   She will be due for followup office visit in 6 months or sooner if needed.  Signed, [address] [place], [place], [facility] 11/12/2014 9:02 AM

## 2014-11-13 LAB — LIPID PANEL
CHOL/HDL RATIO: 2.4 ratio (ref <=5.0)
CHOLESTEROL: 170 mg/dL (ref 125–200)
HDL: 71 mg/dL (ref >=46)
LDL Cholesterol: 76 mg/dL (ref <130)
Triglycerides: 114 mg/dL (ref <150)
VLDL: 23 mg/dL (ref <30)

## 2014-11-13 LAB — COMPLETE METABOLIC PANEL WITH GFR
ALBUMIN: 4.4 g/dL (ref 3.6–5.1)
ALK PHOS: 53 U/L (ref 33–130)
ALT: 16 U/L (ref 6–29)
AST: 20 U/L (ref 10–35)
BILIRUBIN TOTAL: 0.3 mg/dL (ref 0.2–1.2)
BUN: 20 mg/dL (ref 7–25)
CALCIUM: 9.5 mg/dL (ref 8.6–10.4)
CO2: 24 mmol/L (ref 20–31)
Chloride: 101 mmol/L (ref 98–110)
Creat: 0.89 mg/dL (ref 0.60–0.93)
GFR, EST NON AFRICAN AMERICAN: 66 mL/min (ref >=60)
GFR, Est African American: 76 mL/min (ref >=60)
Glucose, Bld: 128 mg/dL — ABNORMAL HIGH (ref 70–99)
POTASSIUM: 4.1 mmol/L (ref 3.5–5.3)
Sodium: 137 mmol/L (ref 135–146)
TOTAL PROTEIN: 7.4 g/dL (ref 6.1–8.1)

## 2014-11-13 LAB — VITAMIN D 25 HYDROXY (VIT D DEFICIENCY, FRACTURES): VIT D 25 HYDROXY: 28 ng/mL — AB (ref 30–100)

## 2014-11-13 LAB — TSH: TSH: 2.016 u[IU]/mL (ref 0.350–4.500)

## 2014-12-06 ENCOUNTER — Other Ambulatory Visit: Payer: Self-pay | Admitting: Physician Assistant

## 2014-12-06 DIAGNOSIS — E2839 Other primary ovarian failure: Secondary | ICD-10-CM

## 2014-12-06 DIAGNOSIS — Z1231 Encounter for screening mammogram for malignant neoplasm of breast: Secondary | ICD-10-CM

## 2015-01-08 ENCOUNTER — Ambulatory Visit: Payer: Self-pay

## 2015-01-08 ENCOUNTER — Other Ambulatory Visit: Payer: Self-pay

## 2015-01-23 ENCOUNTER — Encounter: Payer: Self-pay | Admitting: Physician Assistant

## 2015-01-23 ENCOUNTER — Ambulatory Visit (INDEPENDENT_AMBULATORY_CARE_PROVIDER_SITE_OTHER): Payer: Medicare Other | Admitting: Physician Assistant

## 2015-01-23 VITALS — BP 132/76 | HR 68 | Temp 98.3°F | Resp 18 | Wt 108.0 lb

## 2015-01-23 DIAGNOSIS — R350 Frequency of micturition: Secondary | ICD-10-CM | POA: Diagnosis not present

## 2015-01-23 DIAGNOSIS — N390 Urinary tract infection, site not specified: Secondary | ICD-10-CM

## 2015-01-23 LAB — URINALYSIS, ROUTINE W REFLEX MICROSCOPIC
Bilirubin Urine: NEGATIVE
Glucose, UA: NEGATIVE
KETONES UR: NEGATIVE
NITRITE: POSITIVE — AB
Protein, ur: NEGATIVE
SPECIFIC GRAVITY, URINE: 1.02 (ref 1.001–1.035)
pH: 6.5 (ref 5.0–8.0)

## 2015-01-23 LAB — URINALYSIS, MICROSCOPIC ONLY
CRYSTALS: NONE SEEN [HPF]
Casts: NONE SEEN [LPF]
Yeast: NONE SEEN [HPF]

## 2015-01-23 MED ORDER — CIPROFLOXACIN HCL 500 MG PO TABS: 500.0000 mg | ORAL_TABLET | Freq: Two times a day (BID) | ORAL | Status: DC
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
## 2015-02-08 ENCOUNTER — Telehealth: Payer: Self-pay | Admitting: *Deleted

## 2015-02-08 NOTE — Telephone Encounter (Signed)
Pt called stating she is having symptoms of cold and congestion and eyes are red and swollen, wants to know if you could call something in for her.  [facility]

## 2015-02-08 NOTE — Telephone Encounter (Signed)
Tell her that I am sorry there is nothing I can do, but it is most likely viral and just has to run its course.  If it lasts > 7-10 days or if develops significant fever (100.9) or severe symptoms, call back.

## 2015-02-11 MED ORDER — SULFACETAMIDE SODIUM 10 % OP SOLN: 1.0000 [drp] | OPHTHALMIC | Status: AC

## 2015-02-11 MED ORDER — AMOXICILLIN-POT CLAVULANATE 875-125 MG PO TABS: 1.0000 | ORAL_TABLET | Freq: Two times a day (BID) | ORAL | Status: DC
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
## 2015-02-11 NOTE — Telephone Encounter (Signed)
Pt aware of prescriptions and NTBS if not better

## 2015-02-11 NOTE — Telephone Encounter (Signed)
Has sinus congestion.  Drainage brownish, dark color.  No fever.  Eyes red and burning.  Been sick Thursday.  Not worse but no better.  Using Mucinex, no help.

## 2015-02-17 ENCOUNTER — Other Ambulatory Visit: Payer: Self-pay | Admitting: Physician Assistant

## 2015-02-18 NOTE — Telephone Encounter (Signed)
Medication refilled per protocol. 

## 2015-04-12 ENCOUNTER — Other Ambulatory Visit: Payer: Medicare Other

## 2015-04-12 ENCOUNTER — Other Ambulatory Visit: Payer: Self-pay | Admitting: Family Medicine

## 2015-04-12 ENCOUNTER — Telehealth: Payer: Self-pay | Admitting: Family Medicine

## 2015-04-12 DIAGNOSIS — R8290 Unspecified abnormal findings in urine: Secondary | ICD-10-CM

## 2015-04-12 LAB — URINALYSIS, ROUTINE W REFLEX MICROSCOPIC
Bilirubin Urine: NEGATIVE
Glucose, UA: NEGATIVE
Ketones, ur: NEGATIVE
NITRITE: NEGATIVE
PH: 6 (ref 5.0–8.0)
Protein, ur: NEGATIVE
SPECIFIC GRAVITY, URINE: 1.015 (ref 1.001–1.035)

## 2015-04-12 LAB — URINALYSIS, MICROSCOPIC ONLY
CASTS: NONE SEEN [LPF]
Crystals: NONE SEEN [HPF]
Yeast: NONE SEEN [HPF]

## 2015-04-12 MED ORDER — CEPHALEXIN 500 MG PO CAPS: 500.0000 mg | ORAL_CAPSULE | Freq: Three times a day (TID) | ORAL | Status: DC

## 2015-04-12 NOTE — Telephone Encounter (Signed)
Really needs to do a urine sample because of she has an infection along with a kidney stone that is in emergency.

## 2015-04-12 NOTE — Telephone Encounter (Signed)
Per provider, have patient come by and leave a urine sample.  If infection then can Rx antibiotics.  Reinforced to her urgent situation if has stone and infection.

## 2015-04-12 NOTE — Telephone Encounter (Signed)
Severe left pain last night still hurts some this morning.  Feels she is having kidney stone.  Urinating a lot, urine very foul smelling.  Asking for pain med and antibiotic.  Does not feel like she can get office?

## 2015-04-25 DIAGNOSIS — H524 Presbyopia: Secondary | ICD-10-CM | POA: Diagnosis not present

## 2015-07-23 DIAGNOSIS — I10 Essential (primary) hypertension: Secondary | ICD-10-CM | POA: Diagnosis not present

## 2015-07-23 DIAGNOSIS — H2511 Age-related nuclear cataract, right eye: Secondary | ICD-10-CM | POA: Diagnosis not present

## 2015-07-23 DIAGNOSIS — H2512 Age-related nuclear cataract, left eye: Secondary | ICD-10-CM | POA: Diagnosis not present

## 2015-07-23 DIAGNOSIS — H02839 Dermatochalasis of unspecified eye, unspecified eyelid: Secondary | ICD-10-CM | POA: Diagnosis not present

## 2015-08-07 ENCOUNTER — Encounter: Payer: Self-pay | Admitting: Physician Assistant

## 2015-08-07 ENCOUNTER — Ambulatory Visit (INDEPENDENT_AMBULATORY_CARE_PROVIDER_SITE_OTHER): Payer: Medicare Other | Admitting: Physician Assistant

## 2015-08-07 VITALS — BP 132/70 | Temp 98.1°F | Ht 61.0 in | Wt 104.0 lb

## 2015-08-07 DIAGNOSIS — R21 Rash and other nonspecific skin eruption: Secondary | ICD-10-CM

## 2015-08-07 DIAGNOSIS — T148 Other injury of unspecified body region: Secondary | ICD-10-CM

## 2015-08-07 DIAGNOSIS — W57XXXA Bitten or stung by nonvenomous insect and other nonvenomous arthropods, initial encounter: Secondary | ICD-10-CM | POA: Diagnosis not present

## 2015-08-07 LAB — COMPLETE METABOLIC PANEL WITH GFR
ALT: 33 U/L — ABNORMAL HIGH (ref 6–29)
AST: 36 U/L — AB (ref 10–35)
Albumin: 4.1 g/dL (ref 3.6–5.1)
Alkaline Phosphatase: 66 U/L (ref 33–130)
BUN: 15 mg/dL (ref 7–25)
CALCIUM: 9.2 mg/dL (ref 8.6–10.4)
CHLORIDE: 104 mmol/L (ref 98–110)
CO2: 27 mmol/L (ref 20–31)
Creat: 0.77 mg/dL (ref 0.60–0.93)
GFR, Est African American: >89 mL/min (ref >=60)
GFR, Est Non African American: 78 mL/min (ref >=60)
GLUCOSE: 127 mg/dL — AB (ref 70–99)
POTASSIUM: 3.9 mmol/L (ref 3.5–5.3)
SODIUM: 141 mmol/L (ref 135–146)
Total Bilirubin: 0.3 mg/dL (ref 0.2–1.2)
Total Protein: 7.3 g/dL (ref 6.1–8.1)

## 2015-08-07 LAB — CBC WITH DIFFERENTIAL/PLATELET
Basophils Absolute: 36 cells/uL (ref 0–200)
Basophils Relative: 1 %
EOS PCT: 1 %
Eosinophils Absolute: 36 cells/uL (ref 15–500)
HCT: 36.5 % (ref 35.0–45.0)
Hemoglobin: 12 g/dL (ref 12.0–15.0)
LYMPHS PCT: 29 %
Lymphs Abs: 1044 cells/uL (ref 850–3900)
MCH: 32.6 pg (ref 27.0–33.0)
MCHC: 32.9 g/dL (ref 32.0–36.0)
MCV: 99.2 fL (ref 80.0–100.0)
MONOS PCT: 13 %
MPV: 8.4 fL (ref 7.5–12.5)
Monocytes Absolute: 468 cells/uL (ref 200–950)
NEUTROS PCT: 56 %
Neutro Abs: 2016 cells/uL (ref 1500–7800)
Platelets: 152 10*3/uL (ref 140–400)
RBC: 3.68 MIL/uL — AB (ref 3.80–5.10)
RDW: 13.1 % (ref 11.0–15.0)
WBC: 3.6 10*3/uL — AB (ref 3.8–10.8)

## 2015-08-07 NOTE — Progress Notes (Signed)
Patient ID: Gabrielle LimeMaria F Flamm MRN: [id number], DOB: 1945/09/23, 70 y.o. Date of Encounter: 08/07/2015, 12:59 PM    Chief Complaint:  Chief Complaint  Patient presents with  . Rash    on lower of both legs, pt concerned about tick bite on right hip being the cause of rash      HPI: 70 y.o. year old female presents with above.   Says that this rash has been present on the medial aspect of her calves bilaterally for about 2 weeks. It has not been itchy. Says that she has not noticed any other areas of rash. Does not recall any trauma or injury to that area. Says that about 2 weeks ago she removed a tick and shows me the site from that  --- small pink papule is still present--at the level of waist band on the right --  on the side of her body/flank.  Reports that she has had no headache no fever no diffuse body aches no increased fatigue. No nausea vomiting or diarrhea or abdominal pain. Says that she has had increased stress--- her husband passed away and she has been dealing with the estate.    Home Meds:   Outpatient Prescriptions Prior to Visit  Medication Sig Dispense Refill  . calcium carbonate (OS-CAL) 600 MG TABS Take 600 mg by mouth daily.      . magnesium 30 MG tablet Take 30 mg by mouth 2 (two) times daily.      . metoprolol tartrate (LOPRESSOR) 25 MG tablet TAKE 1 TABLET TWICE A DAY 180 tablet 2  . omeprazole (PRILOSEC) 40 MG capsule Take 1 capsule by mouth daily.    Marland Kitchen. sulfacetamide (BLEPH-10) 10 % ophthalmic solution Place 1 drop into both eyes every 3 (three) hours. 10 mL 0  . vitamin B-12 (CYANOCOBALAMIN) 250 MCG tablet 250 mcg. Twice a week    . amoxicillin-clavulanate (AUGMENTIN) 875-125 MG tablet Take 1 tablet by mouth 2 (two) times daily. 14 tablet 0  . cephALEXin (KEFLEX) 500 MG capsule Take 1 capsule (500 mg total) by mouth 3 (three) times daily. X 7 days 28 capsule 0  . ciprofloxacin (CIPRO) 500 MG tablet Take 1 tablet (500 mg total) by mouth 2 (two) times daily.  14 tablet 0   No facility-administered medications prior to visit.    Allergies:  Allergies  Allergen Reactions  . Codeine Nausea Only    Reaction: Hallucinations.  . Sulfa Antibiotics     Rash       Review of Systems: See HPI for pertinent ROS. All other ROS negative.    Physical Exam: Blood pressure 132/70, temperature 98.1 F (36.7 C), temperature source Oral, height 5\' 1"  (1.549 m), weight 104 lb (47.174 kg)., Body mass index is 19.66 kg/(m^2). General:  Petite female. Appears in no acute distress. Neck: Supple. No thyromegaly. No lymphadenopathy. Lungs: Clear bilaterally to auscultation without wheezes, rales, or rhonchi. Breathing is unlabored. Heart: Regular rhythm. No murmurs, rubs, or gallops. Msk:  Strength and tone normal for age. Extremities/Skin: Medial aspect of bilateral calves---there is macular rash--not raised at all -- looks petichial,--like small capillaries of blood under skin.   Right lateral hip--at level of waist line -- on right flank--there is ~ 1/2 cm pink papule--site of tic bite--- No other rash except above areas Neuro: Alert and oriented X 3. Moves all extremities spontaneously. Gait is normal. CNII-XII grossly in tact. Psych:  Responds to questions appropriately with a normal affect.  ASSESSMENT AND PLAN:  69 y.o. year old female with  1. Tick bite Will check t17he following labs and then follow-up with patient once again these results. Reviewed her medications. She is on no anticoagulants or other meds to cause this rash.  - CBC with Differential/Platelet - COMPLETE METABOLIC PANEL WITH GFR - B. burgdorfi antibodies by WB - Rocky mtn spotted fvr abs pnl(IgG+IgM)  2. Rash - CBC with Differential/Platelet - COMPLETE METABOLIC PANEL WITH GFR - B. burgdorfi antibodies by WB - Rocky mtn spotted fvr abs pnl(IgG+IgM)   Signed, [address] RoadMary Beth San JacintoDixon, [place]PA, [facility] 08/07/2015 12:59 PM

## 2015-08-10 LAB — ROCKY MTN SPOTTED FVR ABS PNL(IGG+IGM)
RMSF IGG: DETECTED — AB
RMSF IgM: NOT DETECTED

## 2015-08-10 LAB — REFLEX RMSF IGG TITER: RMSF IgG Titer: 1:128 {titer} — ABNORMAL HIGH

## 2015-08-14 ENCOUNTER — Telehealth: Payer: Self-pay | Admitting: Family Medicine

## 2015-08-14 LAB — LYME ABY, WSTRN BLT IGG & IGM W/BANDS
B BURGDORFERI IGG ABS (IB): NEGATIVE
B burgdorferi IgM Abs (IB): NEGATIVE
LYME DISEASE 28 KD IGG: NONREACTIVE
LYME DISEASE 39 KD IGG: NONREACTIVE
LYME DISEASE 39 KD IGM: NONREACTIVE
LYME DISEASE 41 KD IGG: NONREACTIVE
LYME DISEASE 45 KD IGG: NONREACTIVE
LYME DISEASE 66 KD IGG: NONREACTIVE
LYME DISEASE 93 KD IGG: NONREACTIVE
Lyme Disease 18 kD IgG: NONREACTIVE
Lyme Disease 23 kD IgG: NONREACTIVE
Lyme Disease 23 kD IgM: NONREACTIVE
Lyme Disease 30 kD IgG: NONREACTIVE
Lyme Disease 41 kD IgM: NONREACTIVE
Lyme Disease 58 kD IgG: NONREACTIVE

## 2015-08-14 MED ORDER — DOXYCYCLINE HYCLATE 100 MG PO TABS: 100.0000 mg | ORAL_TABLET | Freq: Two times a day (BID) | ORAL | Status: AC

## 2015-08-14 NOTE — Telephone Encounter (Signed)
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  --Doxycycline 100mg  1 po BID x 21 days # 42 +0. Also, out of curiousity, has she ever been diagnosed with RMSF in past?

## 2015-08-14 NOTE — Telephone Encounter (Signed)
Pt made aware of lab results and provider recommendations. Rx to pharmacy

## 2015-09-06 ENCOUNTER — Telehealth: Payer: Self-pay | Admitting: Family Medicine

## 2015-09-06 NOTE — Telephone Encounter (Signed)
Pt LMOVM stating that she has finished taking the Doxy for 21 for RMSF days and was wondering if she needed to continue this or not??

## 2015-09-06 NOTE — Telephone Encounter (Signed)
I do not think so if she is feeling well.

## 2015-09-06 NOTE — Telephone Encounter (Signed)
LMTRC

## 2015-11-18 ENCOUNTER — Encounter: Payer: Medicare Other | Admitting: Physician Assistant

## 2015-11-23 ENCOUNTER — Other Ambulatory Visit: Payer: Self-pay | Admitting: Physician Assistant

## 2020-12-15 ENCOUNTER — Emergency Department
Admission: EM | Admit: 2020-12-15 | Discharge: 2020-12-15 | Disposition: A | Discharge status: Home / Self Care | Payer: Medicare Other | Attending: Emergency Medicine | Admitting: Emergency Medicine

## 2020-12-15 ENCOUNTER — Emergency Department: Payer: Medicare Other

## 2020-12-15 ENCOUNTER — Other Ambulatory Visit: Payer: Self-pay

## 2020-12-15 DIAGNOSIS — E119 Type 2 diabetes mellitus without complications: Secondary | ICD-10-CM | POA: Insufficient documentation

## 2020-12-15 DIAGNOSIS — F0390 Unspecified dementia without behavioral disturbance: Secondary | ICD-10-CM | POA: Insufficient documentation

## 2020-12-15 DIAGNOSIS — Z043 Encounter for examination and observation following other accident: Secondary | ICD-10-CM | POA: Insufficient documentation

## 2020-12-15 DIAGNOSIS — I6782 Cerebral ischemia: Secondary | ICD-10-CM | POA: Insufficient documentation

## 2020-12-15 DIAGNOSIS — I10 Essential (primary) hypertension: Secondary | ICD-10-CM | POA: Insufficient documentation

## 2020-12-15 DIAGNOSIS — Z79899 Other long term (current) drug therapy: Secondary | ICD-10-CM | POA: Insufficient documentation

## 2020-12-15 DIAGNOSIS — W19XXXA Unspecified fall, initial encounter: Secondary | ICD-10-CM | POA: Diagnosis not present

## 2020-12-15 DIAGNOSIS — S069X9A Unspecified intracranial injury with loss of consciousness of unspecified duration, initial encounter: Secondary | ICD-10-CM | POA: Diagnosis present

## 2020-12-15 LAB — CBC WITH DIFFERENTIAL/PLATELET
Abs Immature Granulocytes: 0.04 10*3/uL (ref 0.00–0.07)
Basophils Absolute: 0 10*3/uL (ref 0.0–0.1)
Basophils Relative: 0 %
Eosinophils Absolute: 0.1 10*3/uL (ref 0.0–0.5)
Eosinophils Relative: 2 %
HCT: 32.3 % — ABNORMAL LOW (ref 36.0–46.0)
Hemoglobin: 11.1 g/dL — ABNORMAL LOW (ref 12.0–15.0)
Immature Granulocytes: 1 %
Lymphocytes Relative: 33 %
Lymphs Abs: 1.7 10*3/uL (ref 0.7–4.0)
MCH: 32.9 pg (ref 26.0–34.0)
MCHC: 34.4 g/dL (ref 30.0–36.0)
MCV: 95.8 fL (ref 80.0–100.0)
Monocytes Absolute: 0.6 10*3/uL (ref 0.1–1.0)
Monocytes Relative: 11 %
Neutro Abs: 2.6 10*3/uL (ref 1.7–7.7)
Neutrophils Relative %: 53 %
Platelets: 228 10*3/uL (ref 150–400)
RBC: 3.37 MIL/uL — ABNORMAL LOW (ref 3.87–5.11)
RDW: 13.2 % (ref 11.5–15.5)
WBC: 5 10*3/uL (ref 4.0–10.5)
nRBC: 0 % (ref 0.0–0.2)

## 2020-12-15 LAB — COMPREHENSIVE METABOLIC PANEL
ALT: 19 U/L (ref 0–44)
AST: 24 U/L (ref 15–41)
Albumin: 3.7 g/dL (ref 3.5–5.0)
Alkaline Phosphatase: 64 U/L (ref 38–126)
Anion gap: 6 (ref 5–15)
BUN: 26 mg/dL — ABNORMAL HIGH (ref 8–23)
CO2: 27 mmol/L (ref 22–32)
Calcium: 9 mg/dL (ref 8.9–10.3)
Chloride: 101 mmol/L (ref 98–111)
Creatinine, Ser: 1.32 mg/dL — ABNORMAL HIGH (ref 0.44–1.00)
GFR, Estimated: 42 mL/min — ABNORMAL LOW (ref >60)
Glucose, Bld: 196 mg/dL — ABNORMAL HIGH (ref 70–99)
Potassium: 3.9 mmol/L (ref 3.5–5.1)
Sodium: 134 mmol/L — ABNORMAL LOW (ref 135–145)
Total Bilirubin: 0.6 mg/dL (ref 0.3–1.2)
Total Protein: 7.3 g/dL (ref 6.5–8.1)

## 2020-12-15 LAB — TROPONIN I (HIGH SENSITIVITY): Troponin I (High Sensitivity): 6 ng/L (ref <18)

## 2020-12-15 NOTE — ED Provider Notes (Signed)
[facility] Emergency Department Provider Note   ____________________________________________   Event Date/Time   First MD Initiated Contact with Patient 12/15/20 2211     (approximate)  I have reviewed the triage vital signs and the nursing notes.   HISTORY  Chief Complaint Fall (Fall fm unknown distance)    HPI Gabrielle Griffith is a 75 y.o. female who presents via EMS after being found down unconscious at the bottom of a 16 stair stairwell.  Patient denies any complaints at this time or any pain and does not remember how she got to the bottom of the stairs.  Patient does have a reported history of dementia per family who is at bedside           Past Medical History:  Diagnosis Date   Anxiety    Barrett's esophagus    GERD (gastroesophageal reflux disease)    Hyperglycemia    Hypertension    Insomnia    NIDDM (non-insulin dependent diabetes mellitus)     Patient Active Problem List   Diagnosis Date Noted   GERD (gastroesophageal reflux disease)    Hypertension    Hyperglycemia    Insomnia    Barrett's esophagus     Prior to Admission medications   Medication Sig Start Date End Date Taking? Authorizing Provider  calcium carbonate (OS-CAL) 600 MG TABS Take 600 mg by mouth daily.      [provider]  doxycycline (VIBRA-TABS) 100 MG tablet Take 1 tablet (100 mg total) by mouth 2 (two) times daily. For 21 days 08/14/15   Dorena Bodo, PA-C  magnesium 30 MG tablet Take 30 mg by mouth 2 (two) times daily.      [provider]  metoprolol tartrate (LOPRESSOR) 25 MG tablet TAKE 1 TABLET TWICE A DAY 11/25/15   Donita Brooks, MD  omeprazole (PRILOSEC) 40 MG capsule Take 1 capsule by mouth daily. 10/25/13   [provider]  sulfacetamide (BLEPH-10) 10 % ophthalmic solution Place 1 drop into both eyes every 3 (three) hours. 02/11/15   Dorena Bodo, PA-C  vitamin B-12 (CYANOCOBALAMIN) 250 MCG tablet 250 mcg. Twice a  week    [provider]    Allergies Codeine and Sulfa antibiotics  No family history on file.  Social History Social History   Tobacco Use   Smoking status: Light Smoker   Smokeless tobacco: Never   Tobacco comments:    aprox 2 a day  Substance Use Topics   Alcohol use: Yes    Alcohol/week: 0.0 standard drinks   Drug use: No    Review of Systems Constitutional: No fever/chills Eyes: No visual changes. ENT: No sore throat. Cardiovascular: Denies chest pain. Respiratory: Denies shortness of breath. Gastrointestinal: No abdominal pain.  No nausea, no vomiting.  No diarrhea. Genitourinary: Negative for dysuria. Musculoskeletal: Negative for acute arthralgias Skin: Negative for rash. Neurological: Negative for headaches, weakness/numbness/paresthesias in any extremity Psychiatric: Negative for suicidal ideation/homicidal ideation   ____________________________________________   PHYSICAL EXAM:  VITAL SIGNS: ED Triage Vitals  Enc Vitals Group     BP 12/15/20 2216 134/80     Pulse Rate 12/15/20 2216 (!) 116     Resp 12/15/20 2216 17     Temp 12/15/20 2221 97.8 F (36.6 C)     Temp Source 12/15/20 2221 Oral     SpO2 12/15/20 2216 99 %     Weight 12/15/20 2217 99 lb 9.6 oz (45.2 kg)  Height --      Head Circumference --      Peak Flow --      Pain Score 12/15/20 2217 0     Pain Loc --      Pain Edu? --      Excl. in GC? --    Constitutional: Alert and disoriented. Well appearing and in no acute distress. Eyes: Conjunctivae are normal. PERRL. Head: Atraumatic. Nose: No congestion/rhinnorhea. Mouth/Throat: Mucous membranes are moist. Neck: No stridor, c-collar in place Cardiovascular: Grossly normal heart sounds.  Good peripheral circulation. Respiratory: Normal respiratory effort.  No retractions. Gastrointestinal: Soft and nontender. No distention. Musculoskeletal: No obvious deformities Neurologic:  Normal speech and language. No gross focal  neurologic deficits are appreciated. Skin:  Skin is warm and dry. No rash noted. Psychiatric: Mood and affect are normal. Speech and behavior are normal.  ____________________________________________   LABS (all labs ordered are listed, but only abnormal results are displayed)  Labs Reviewed  COMPREHENSIVE METABOLIC PANEL - Abnormal; Notable for the following components:      Result Value   Sodium 134 (*)    Glucose, Bld 196 (*)    BUN 26 (*)    Creatinine, Ser 1.32 (*)    GFR, Estimated 42 (*)    All other components within normal limits  CBC WITH DIFFERENTIAL/PLATELET - Abnormal; Notable for the following components:   RBC 3.37 (*)    Hemoglobin 11.1 (*)    HCT 32.3 (*)    All other components within normal limits  TROPONIN I (HIGH SENSITIVITY)   ____________________________________________  EKG  ED ECG REPORT I, Merwyn Katos, the attending physician, personally viewed and interpreted this ECG.  Date: 12/15/2020 EKG Time: 2212 Rate: 116 Rhythm: Tachycardic sinus rhythm QRS Axis: normal Intervals: normal ST/T Wave abnormalities: normal Narrative Interpretation: Tachycardic sinus rhythm.  No evidence of acute ischemia  ____________________________________________  RADIOLOGY  ED MD interpretation: CT of the head without contrast shows no evidence of acute abnormalities including no intracerebral hemorrhage, obvious masses, or significant edema  CT of the cervical spine does not show any evidence of acute abnormalities including no acute fracture, malalignment, height loss, or dislocation  Official radiology report(s): No results found.  ____________________________________________   PROCEDURES  Procedure(s) performed (including Critical Care):  .1-3 Lead EKG Interpretation Performed by: Merwyn Katos, MD Authorized by: Merwyn Katos, MD     Interpretation: normal     ECG rate:  95   ECG rate assessment: normal     Rhythm: sinus rhythm      Ectopy: none     Conduction: normal     ____________________________________________   INITIAL IMPRESSION / ASSESSMENT AND PLAN / ED COURSE  As part of my medical decision making, I reviewed the following data within the electronic medical record, if available:  Nursing notes reviewed and incorporated, Labs reviewed, EKG interpreted, Old chart reviewed, Radiograph reviewed and Notes from prior ED visits reviewed and incorporated        Presenting after a fall that occurred just prior to arrival, resulting in injury to the scalp. The mechanism of injury was a mechanical ground level fall without syncope or near-syncope. The current level of pain is moderate. There was no loss of consciousness, confusion, seizure, or memory impairment. There is not a laceration associated with the injury. Denies neck pain. The patient does not take blood thinner medications. Denies vomiting, numbness/weakness, fever  Dispo: Discharge with PCP follow-up  ____________________________________________   FINAL CLINICAL IMPRESSION(S) / ED DIAGNOSES  Final diagnoses:  Fall, initial encounter     ED Discharge Orders     None        Note:  This document was prepared using Dragon voice recognition software and may include unintentional dictation errors.    Merwyn Katos, MD 12/18/20 1041

## 2020-12-15 NOTE — ED Triage Notes (Signed)
Pt had an unwitnessed fall.  Last seen up 16 stairs. Found 10 min later at the bottom. Pt denied any pain enroute to EMS.  Unable to see, but pt poor historian. C-collar applied prior to arrival.

## 2023-02-03 DEATH — deceased
